# Patient Record
Sex: Female | Born: 1970 | Race: Black or African American | Hispanic: No | Marital: Single | Smoking: Never smoker
Health system: Southern US, Community
[De-identification: ages and names within clinical notes are randomized; demographics above are authoritative.]

## PROBLEM LIST (undated history)

## (undated) DIAGNOSIS — R112 Nausea with vomiting, unspecified: Secondary | ICD-10-CM

## (undated) DIAGNOSIS — T7840XA Allergy, unspecified, initial encounter: Secondary | ICD-10-CM

## (undated) DIAGNOSIS — K219 Gastro-esophageal reflux disease without esophagitis: Secondary | ICD-10-CM

## (undated) DIAGNOSIS — T8859XA Other complications of anesthesia, initial encounter: Secondary | ICD-10-CM

## (undated) DIAGNOSIS — D259 Leiomyoma of uterus, unspecified: Secondary | ICD-10-CM

## (undated) DIAGNOSIS — Z9889 Other specified postprocedural states: Secondary | ICD-10-CM

## (undated) DIAGNOSIS — K209 Esophagitis, unspecified: Secondary | ICD-10-CM

## (undated) DIAGNOSIS — T4145XA Adverse effect of unspecified anesthetic, initial encounter: Secondary | ICD-10-CM

## (undated) HISTORY — DX: Other specified postprocedural states: R11.2

## (undated) HISTORY — DX: Other specified postprocedural states: Z98.890

## (undated) HISTORY — DX: Leiomyoma of uterus, unspecified: D25.9

## (undated) HISTORY — DX: Allergy, unspecified, initial encounter: T78.40XA

## (undated) HISTORY — DX: Other complications of anesthesia, initial encounter: T88.59XA

---
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
## 1997-10-20 ENCOUNTER — Ambulatory Visit (HOSPITAL_COMMUNITY): Admission: RE | Admit: 1997-10-20 | Discharge: 1997-10-20 | Payer: Self-pay | Admitting: *Deleted

## 1998-03-29 ENCOUNTER — Other Ambulatory Visit: Admission: RE | Admit: 1998-03-29 | Discharge: 1998-03-29 | Payer: Self-pay | Admitting: Obstetrics and Gynecology

## 1998-06-15 ENCOUNTER — Ambulatory Visit (HOSPITAL_COMMUNITY): Admission: RE | Admit: 1998-06-15 | Discharge: 1998-06-15 | Payer: Self-pay | Admitting: Obstetrics and Gynecology

## 1998-09-12 ENCOUNTER — Inpatient Hospital Stay (HOSPITAL_COMMUNITY): Admission: AD | Admit: 1998-09-12 | Discharge: 1998-09-15 | Payer: Self-pay | Admitting: Obstetrics and Gynecology

## 2002-11-23 ENCOUNTER — Encounter: Payer: Self-pay | Admitting: *Deleted

## 2002-11-23 ENCOUNTER — Emergency Department (HOSPITAL_COMMUNITY): Admission: EM | Admit: 2002-11-23 | Discharge: 2002-11-23 | Payer: Self-pay | Admitting: *Deleted

## 2006-02-11 ENCOUNTER — Emergency Department (HOSPITAL_COMMUNITY): Admission: EM | Admit: 2006-02-11 | Discharge: 2006-02-11 | Payer: Self-pay | Admitting: Family Medicine

## 2007-04-27 ENCOUNTER — Emergency Department (HOSPITAL_COMMUNITY): Admission: EM | Admit: 2007-04-27 | Discharge: 2007-04-27 | Payer: Self-pay | Admitting: Emergency Medicine

## 2007-05-27 ENCOUNTER — Ambulatory Visit: Payer: Self-pay | Admitting: Gastroenterology

## 2007-06-12 ENCOUNTER — Ambulatory Visit: Payer: Self-pay | Admitting: Gastroenterology

## 2007-08-23 ENCOUNTER — Emergency Department (HOSPITAL_COMMUNITY): Admission: EM | Admit: 2007-08-23 | Discharge: 2007-08-23 | Payer: Self-pay | Admitting: Emergency Medicine

## 2007-11-17 DIAGNOSIS — R1310 Dysphagia, unspecified: Secondary | ICD-10-CM | POA: Insufficient documentation

## 2007-11-17 DIAGNOSIS — K219 Gastro-esophageal reflux disease without esophagitis: Secondary | ICD-10-CM

## 2007-11-17 DIAGNOSIS — K210 Gastro-esophageal reflux disease with esophagitis: Secondary | ICD-10-CM

## 2008-11-18 ENCOUNTER — Ambulatory Visit (HOSPITAL_COMMUNITY): Admission: RE | Admit: 2008-11-18 | Discharge: 2008-11-18 | Payer: Self-pay | Admitting: Family Medicine

## 2011-01-16 NOTE — Assessment & Plan Note (Signed)
 HEALTHCARE                         GASTROENTEROLOGY OFFICE NOTE   NAME:SOLESJacqualin, [name]                        MRN:          [id number]  DATE:05/27/2007                            DOB:          March 26, 1971    Meril Dray REQUESTING CONSULTATION:  Terrilee Files, P.A.   REASON FOR CONSULTATION:  Dysphagia and reflux symptoms.   HISTORY OF PRESENT ILLNESS:  [name] is a 40 year old African American  who relates a one month history of difficulty swallowing. She also notes  a burning pain with swallowing. Her symptoms are present with liquids  and solids, but are much more noticeable with solids. She was treated  with Prilosec 20 mg a day for 2 weeks and her symptoms improved. She  originally presented to [facility] on April 27, 2007. Her symptoms worsened that day when she was eating eggs and she  had to induce vomiting to alleviate her symptoms of dysphagia. A soft-  tissue view of the neck was obtained which showed C5-C6 degenerative  change and no acute abnormalities. Terrilee Files, P.A. discussed the  situation with Dr. Leone Payor who agreed to schedule the patient in  followup in his office. The patient is scheduled with me at this time.  She relates no weight loss, recent antibiotic usage, abdominal pain,  melena, hematochezia, constipation, diarrhea, nausea or vomiting.   PAST MEDICAL HISTORY:  Negative.   PAST SURGICAL HISTORY:  Negative.   CURRENT MEDICATIONS:  Prilosec over-the-counter daily p.r.n.-has not  been taking recently.   MEDICATION ALLERGIES:  PERCOCET.   SOCIAL HISTORY:  Per the handwritten form.   REVIEW OF SYSTEMS:  Per the handwritten form.   PHYSICAL EXAMINATION:  Well-developed, well-nourished in no acute  distress. Height 5 feet. Weight 130 pounds. Blood pressure 100/70, pulse  76 and regular.  HEENT: Anicteric sclerae. Oropharynx clear.  NECK: Without thyromegaly or adenopathy.  CHEST: Clear to  auscultation bilaterally.  CARDIAC: Regular rate and rhythm without murmurs.  ABDOMEN: Soft and nontender. Nondistended. Normoactive bowel sounds. No  palpable organomegaly, masses or hernias.  EXTREMITIES: Without clubbing, cyanosis or edema.  NEUROLOGIC: Alert and oriented x3. Grossly nonfocal.   ASSESSMENT/PLAN:  1. Dysphagia and odynophagia, rule out esophagitis, strictures,      gastroesophageal reflux disease and other disorders. She is      recommended to take Prilosec over-the-counter one p.o. q a.m. 30-60      minutes before breakfast as an ongoing medication. Begin all      standard anti-reflux measures. Risks, benefits and alternative to      upper endoscopy with possible biopsy and possible dilation      discussed with the patient and she consents to proceed. This will      be scheduled electively.     Venita Lick. Russella Dar, MD, [facility]  Electronically Signed    MTS/MedQ  DD: 05/27/2007  DT: 05/27/2007  Job #: [id number]   cc:   Terrilee Files, P.A.

## 2011-05-24 ENCOUNTER — Other Ambulatory Visit (HOSPITAL_COMMUNITY)
Admission: RE | Admit: 2011-05-24 | Discharge: 2011-05-24 | Disposition: A | Discharge status: Home / Self Care | Payer: BC Managed Care – PPO | Source: Ambulatory Visit | Attending: Women's Health | Admitting: Women's Health

## 2011-05-24 ENCOUNTER — Encounter: Payer: Self-pay | Admitting: Women's Health

## 2011-05-24 ENCOUNTER — Ambulatory Visit (INDEPENDENT_AMBULATORY_CARE_PROVIDER_SITE_OTHER): Payer: BC Managed Care – PPO | Admitting: Women's Health

## 2011-05-24 VITALS — BP 118/74 | Ht 61.0 in | Wt 134.0 lb

## 2011-05-24 DIAGNOSIS — Z01419 Encounter for gynecological examination (general) (routine) without abnormal findings: Secondary | ICD-10-CM

## 2011-05-24 DIAGNOSIS — E079 Disorder of thyroid, unspecified: Secondary | ICD-10-CM

## 2011-05-24 DIAGNOSIS — Z113 Encounter for screening for infections with a predominantly sexual mode of transmission: Secondary | ICD-10-CM

## 2011-05-24 NOTE — Patient Instructions (Signed)
Schedule mammogram.

## 2011-05-24 NOTE — Progress Notes (Signed)
[name] 1971/05/05 [id number]    History:    The patient presents for annual exam.  K1 teacher. Daughter [name] 9,  son [name] 72, doing well.  Past medical history, past surgical history, family history and social history were all reviewed and documented in the EPIC chart.   ROS:  A  ROS was performed and pertinent positives and negatives are included in the history.  Exam:  Filed Vitals:   05/24/11 1615  BP: 118/74    General appearance:  Normal Head/Neck:  Normal, without cervical or supraclavicular adenopathy. Thyroid:  Symmetrical, normal in size, without palpable masses or nodularity. Respiratory  Effort:  Normal  Auscultation:  Clear without wheezing or rhonchi Cardiovascular  Auscultation:  Regular rate, without rubs, murmurs or gallops  Edema/varicosities:  Not grossly evident Abdominal  Soft,nontender, without masses, guarding or rebound.  Liver/spleen:  No organomegaly noted  Hernia:  None appreciated  Skin  Inspection:  Grossly normal  Palpation:  Grossly normal Neurologic/psychiatric  Orientation:  Normal with appropriate conversation.  Mood/affect:  Normal  Genitourinary    Breasts: Examined lying and sitting/pendulous.     Right: Without masses, retractions, discharge or axillary adenopathy.     Left: Without masses, retractions, discharge or axillary adenopathy.   Inguinal/mons:  Normal without inguinal adenopathy  External genitalia:  Normal  BUS/Urethra/Skene's glands:  Normal  Bladder:  Normal  Vagina:  Normal  Cervix:  Normal/stenotic  Uterus:  retroverted, normal in size, shape and contour.  Midline and mobile  Adnexa/parametria:     Rt: Without masses or tenderness.   Lt: Without masses or tenderness.  Anus and perineum: Normal  Digital rectal exam: Normal sphincter tone without palpated masses or tenderness  Assessment/Plan:  40 y.o. SBF G3P2 for annual exam. Monthly 3 days cycle/vasectomy. Complaint of skin dryness and some skin  changes. She does have followup with a dermatologist. Minimal health care in the last few years. Same partner x2 years.  Normal GYN exam  Plan: SBEs, had normal mammogram 11/2008, reviewed importance of annual screening. She will schedule mammogram at breast center. Calcium rich diet, exercise, cutting calories for weight loss encouraged. CBC, TSH, UA, Pap, GC Chlamydia, HIV, hepatitis B. and C. and RPR. Encouraged flu vaccine.   Harrington Challenger [facility], 4:53 PM 05/24/2011

## 2011-05-26 LAB — HEPATITIS B SURFACE ANTIGEN: Hepatitis B Surface Ag: NEGATIVE

## 2011-05-26 LAB — HIV ANTIBODY (ROUTINE TESTING W REFLEX): HIV: NONREACTIVE

## 2011-06-13 ENCOUNTER — Other Ambulatory Visit: Payer: Self-pay | Admitting: Women's Health

## 2011-06-13 DIAGNOSIS — Z1231 Encounter for screening mammogram for malignant neoplasm of breast: Secondary | ICD-10-CM

## 2011-06-21 ENCOUNTER — Ambulatory Visit (HOSPITAL_COMMUNITY)
Admission: RE | Admit: 2011-06-21 | Discharge: 2011-06-21 | Disposition: A | Discharge status: Home / Self Care | Payer: BC Managed Care – PPO | Source: Ambulatory Visit | Attending: Women's Health | Admitting: Women's Health

## 2011-06-21 DIAGNOSIS — Z1231 Encounter for screening mammogram for malignant neoplasm of breast: Secondary | ICD-10-CM | POA: Insufficient documentation

## 2012-05-26 ENCOUNTER — Encounter: Payer: Self-pay | Admitting: Women's Health

## 2012-05-26 ENCOUNTER — Ambulatory Visit (INDEPENDENT_AMBULATORY_CARE_PROVIDER_SITE_OTHER): Payer: BC Managed Care – PPO | Admitting: Women's Health

## 2012-05-26 VITALS — BP 114/74 | Ht 61.0 in | Wt 139.0 lb

## 2012-05-26 DIAGNOSIS — Z833 Family history of diabetes mellitus: Secondary | ICD-10-CM

## 2012-05-26 DIAGNOSIS — E079 Disorder of thyroid, unspecified: Secondary | ICD-10-CM

## 2012-05-26 DIAGNOSIS — Z01419 Encounter for gynecological examination (general) (routine) without abnormal findings: Secondary | ICD-10-CM

## 2012-05-26 DIAGNOSIS — Z13220 Encounter for screening for lipoid disorders: Secondary | ICD-10-CM

## 2012-05-26 DIAGNOSIS — Z23 Encounter for immunization: Secondary | ICD-10-CM

## 2012-05-26 NOTE — Progress Notes (Signed)
ANNEKA MULLALY 1971/05/11 [id number]    History:    The patient presents for annual exam.  Monthly 3-4 d cycle/vasectomy. Normal Pap and mammogram history. History of slight anemia.   Past medical history, past surgical history, family history and social history were all reviewed and documented in the EPIC chart. Daughter Gavin Pound 60 pregnant attending [employer]. Son [name] 13 doing well. Kindergarten Runner, broadcasting/film/video.  ROS:  A  ROS was performed and pertinent positives and negatives are included in the history.  Exam:  Filed Vitals:   05/26/12 1544  BP: 114/74    General appearance:  Normal Head/Neck:  Normal, without cervical or supraclavicular adenopathy. Thyroid:  Symmetrical, normal in size, without palpable masses or nodularity. Respiratory  Effort:  Normal  Auscultation:  Clear without wheezing or rhonchi Cardiovascular  Auscultation:  Regular rate, without rubs, murmurs or gallops  Edema/varicosities:  Not grossly evident Abdominal  Soft,nontender, without masses, guarding or rebound.  Liver/spleen:  No organomegaly noted  Hernia:  Mild asymptomatic abdominal/umbilical   Skin  Inspection:  Grossly normal  Palpation:  Grossly normal Neurologic/psychiatric  Orientation:  Normal with appropriate conversation.  Mood/affect:  Normal  Genitourinary    Breasts: Examined lying and sitting/pendulous.     Right: Without masses, retractions, discharge or axillary adenopathy.     Left: Without masses, retractions, discharge or axillary adenopathy.   Inguinal/mons:  Normal without inguinal adenopathy  External genitalia:  Normal  BUS/Urethra/Skene's glands:  Normal  Bladder:  Normal  Vagina:  Normal  Cervix:  Normal  Uterus:   normal in size, shape and contour.  Midline and mobile  Adnexa/parametria:     Rt: Without masses or tenderness.   Lt: Without masses or tenderness.  Anus and perineum: Normal  Digital rectal exam: Normal sphincter tone without palpated masses or  tenderness  Assessment/Plan:  41 y.o. SBF G2 P2 for annual exam with complaint of lip swelling/ unknown etiology.  Normal GYN exam  Plan: SBE's, continue annual mammogram, calcium rich diet, MVI daily with iron daily and increase iron rich foods. CBC, glucose, lipid panel, TSH, UA and Pap not done history of normal Paps, new screening guidelines reviewed. Followup with dermatologist as needed for with swelling/allergy.    Harrington Challenger [facility], 5:19 PM 05/26/2012

## 2012-05-26 NOTE — Patient Instructions (Signed)

## 2012-05-27 ENCOUNTER — Telehealth: Payer: Self-pay | Admitting: *Deleted

## 2012-05-27 DIAGNOSIS — K429 Umbilical hernia without obstruction or gangrene: Secondary | ICD-10-CM

## 2012-05-27 LAB — CBC WITH DIFFERENTIAL/PLATELET
Eosinophils Absolute: 0.2 10*3/uL (ref 0.0–0.7)
Hemoglobin: 12.7 g/dL (ref 12.0–15.0)
Lymphocytes Relative: 36 % (ref 12–46)
Lymphs Abs: 2.2 10*3/uL (ref 0.7–4.0)
MCH: 27.4 pg (ref 26.0–34.0)
MCV: 83 fL (ref 78.0–100.0)
Monocytes Relative: 6 % (ref 3–12)
Neutrophils Relative %: 53 % (ref 43–77)
RBC: 4.64 MIL/uL (ref 3.87–5.11)
WBC: 6.2 10*3/uL (ref 4.0–10.5)

## 2012-05-27 LAB — URINALYSIS W MICROSCOPIC + REFLEX CULTURE
Bacteria, UA: NONE SEEN
Bilirubin Urine: NEGATIVE
Casts: NONE SEEN
Glucose, UA: NEGATIVE mg/dL
Hgb urine dipstick: NEGATIVE
Ketones, ur: NEGATIVE mg/dL
Protein, ur: NEGATIVE mg/dL
pH: 7 (ref 5.0–8.0)

## 2012-05-27 LAB — TSH: TSH: 1.961 u[IU]/mL (ref 0.350–4.500)

## 2012-05-27 LAB — LIPID PANEL
Cholesterol: 196 mg/dL (ref 0–200)
HDL: 33 mg/dL — ABNORMAL LOW (ref >39)
Total CHOL/HDL Ratio: 5.9 Ratio
Triglycerides: 224 mg/dL — ABNORMAL HIGH (ref <150)

## 2012-05-27 LAB — GLUCOSE, RANDOM: Glucose, Bld: 114 mg/dL — ABNORMAL HIGH (ref 70–99)

## 2012-05-27 NOTE — Telephone Encounter (Signed)
Pt was seen for annual on 05/26/12 she spoke with you about stomach hernia and a referral with general surgeon? nonething in office note about this? Please advise

## 2012-05-28 NOTE — Telephone Encounter (Signed)
Telephone call, states after office visit thought about abdominal/ umbilical hernia. States has had a burning sensation, abdominal pain at times denies any constipation or changes in bowel limitation. States has had increasing pain over the last 6 months had attributed to stress of her mother dying. States would like to be evaluated by surgeon.(There is a notation of hernia on physical )   [name] please schedule surgical consult for evaluation of abdominal/umbilical hernia. Symptoms abdominal pain with burning sensation for greater than 6 months. Dr. Jamey Ripa or or anyone in the group

## 2012-05-29 NOTE — Telephone Encounter (Signed)
Appointment with Dr.Rosenbower on 06/02/12 @ 8:50 am. Pt informed with the below note.

## 2012-06-02 ENCOUNTER — Encounter (INDEPENDENT_AMBULATORY_CARE_PROVIDER_SITE_OTHER): Payer: Self-pay | Admitting: General Surgery

## 2012-06-02 ENCOUNTER — Ambulatory Visit (INDEPENDENT_AMBULATORY_CARE_PROVIDER_SITE_OTHER): Payer: BC Managed Care – PPO | Admitting: General Surgery

## 2012-06-02 VITALS — BP 122/64 | HR 80 | Temp 97.0°F | Resp 16 | Ht 65.0 in | Wt 135.6 lb

## 2012-06-02 DIAGNOSIS — K429 Umbilical hernia without obstruction or gangrene: Secondary | ICD-10-CM

## 2012-06-02 DIAGNOSIS — M6208 Separation of muscle (nontraumatic), other site: Secondary | ICD-10-CM

## 2012-06-02 DIAGNOSIS — M6200 Separation of muscle (nontraumatic), unspecified site: Secondary | ICD-10-CM

## 2012-06-02 NOTE — Patient Instructions (Signed)
You have a small umbilical hernia and a diastases recti. These are not surgical problems at this time. You may also have a hiatal hernia which could be causing some of your symptoms. I recommend you avoid spicy foods, fried foods, and caffeine. I recommend an exercise program and weight loss. I recommend you take Prilosec once a day.

## 2012-06-02 NOTE — Progress Notes (Signed)
Patient ID: [name], female   DOB: 26-Jun-1971, 41 y.o.   MRN: [id number]  Chief Complaint  Patient presents with  . Pre-op Exam    eval umb hernia    HPI [name] is a 41 y.o. female.   HPI  She is referred by [name], nurse practitioner, for evaluation of an umbilical hernia. She has been having epigastric pains for the past year or 2. They have become more frequent recently and basically are pressure type pains. When she pushes in on the epigastric region she gets relief.  She does have some gastroesophageal reflux. All small umbilical hernia was noticed on her exam. She is here for evaluation of that.  Past Medical History  Diagnosis Date  . Hypertension     Past Surgical History  Procedure Date  . Cesarean section     Family History  Problem Relation Age of Onset  . COPD Mother   . Hypertension Mother   . Hypertension Father   . Breast cancer Maternal Aunt     after menopause  . Cancer Maternal Aunt     breast  . Cancer Maternal Grandmother     ovarian or cervical    Social History History  Substance Use Topics  . Smoking status: Never Smoker   . Smokeless tobacco: Never Used  . Alcohol Use: Yes     rare/ socially    Allergies  Allergen Reactions  . Percocet (Oxycodone-Acetaminophen)     Current Outpatient Prescriptions  Medication Sig Dispense Refill  . Multiple Vitamin (MULTIVITAMIN) tablet Take 1 tablet by mouth daily.          Review of Systems Review of Systems  Constitutional: Negative.   Respiratory: Negative.   Cardiovascular: Negative.   Gastrointestinal: Positive for abdominal pain and abdominal distention. Negative for vomiting.    Blood pressure 122/64, pulse 80, temperature 97 F (36.1 C), temperature source Temporal, resp. rate 16, height 5\' 5"  (1.651 m), weight 135 lb 9.6 oz (61.508 kg), last menstrual period 05/22/2012.  Physical Exam Physical Exam  Constitutional:       Overweight female in no acute distress  HENT:    Head: Normocephalic and atraumatic.  Abdominal: Soft. She exhibits no distension and no mass. There is no tenderness.       Small reducible umbilical hernia with no tenderness. A diastases recti is also present.  Skin: Skin is warm and dry.    Data Reviewed Office note.  Assessment    1. Small asymptomatic umbilical hernia  2. Diastases recti  3. Epigastric pain    Plan    I do not think she needs repair of her umbilical hernia at this time. The diastases recti does not need repair. Some of her symptoms may be coming from a hiatal hernia and so I gave her some dietary recommendations. I also recommended she start taking daily Prilosec. I suggested she start on an exercise program and avoid spicy foods and fatty foods.       Ariana Cavenaugh J 06/02/2012, 9:57 AM

## 2013-05-27 ENCOUNTER — Encounter: Payer: BC Managed Care – PPO | Admitting: Women's Health

## 2013-06-15 ENCOUNTER — Emergency Department (HOSPITAL_COMMUNITY)
Admission: EM | Admit: 2013-06-15 | Discharge: 2013-06-16 | Disposition: A | Discharge status: Home / Self Care | Payer: BC Managed Care – PPO | Attending: Emergency Medicine | Admitting: Emergency Medicine

## 2013-06-15 ENCOUNTER — Encounter (HOSPITAL_COMMUNITY): Payer: Self-pay | Admitting: Emergency Medicine

## 2013-06-15 DIAGNOSIS — Z8719 Personal history of other diseases of the digestive system: Secondary | ICD-10-CM | POA: Insufficient documentation

## 2013-06-15 DIAGNOSIS — J029 Acute pharyngitis, unspecified: Secondary | ICD-10-CM

## 2013-06-15 HISTORY — DX: Esophagitis, unspecified: K20.9

## 2013-06-15 LAB — RAPID STREP SCREEN (MED CTR MEBANE ONLY): Streptococcus, Group A Screen (Direct): NEGATIVE

## 2013-06-15 NOTE — ED Notes (Signed)
Unable to locate pt  

## 2013-06-15 NOTE — ED Provider Notes (Signed)
CSN: [id number]     Arrival date & time 06/15/13  2235 History   First MD Initiated Contact with Patient 06/15/13 2307     Chief Complaint  Patient presents with  . Sore Throat   (Consider location/radiation/quality/duration/timing/severity/associated sxs/prior Treatment) HPI Comments: Patient presents with a 2 day history of throat pain.  Reports increase in pain with swallowing.  Reports using  throat numbing and salt water gargles at home without relief.  Denies fever, cough, rash, nausea, vomiting, abdominal pain, diarrhea, rhinorrhea, and ear pain. No known sick contacts.    Patient is a 42 y.o. female presenting with pharyngitis. The history is provided by the patient.  Sore Throat    Past Medical History  Diagnosis Date  . Esophagitis    Past Surgical History  Procedure Laterality Date  . Cesarean section     Family History  Problem Relation Age of Onset  . COPD Mother   . Hypertension Mother   . Hypertension Father   . Breast cancer Maternal Aunt     after menopause  . Cancer Maternal Aunt     breast  . Cancer Maternal Grandmother     ovarian or cervical   History  Substance Use Topics  . Smoking status: Never Smoker   . Smokeless tobacco: Never Used  . Alcohol Use: Yes     Comment: rare/ socially   OB History   Grav Para Term Preterm Abortions TAB SAB Ect Mult Living   3 2 2  1  1   2      Review of Systems  All other systems reviewed and are negative.    Allergies  Percocet  Home Medications  No current outpatient prescriptions on file. BP 138/71  Pulse 88  Temp(Src) 98.4 F (36.9 C) (Oral)  Resp 18  Wt 142 lb 3.2 oz (64.5 kg)  BMI 23.66 kg/m2  SpO2 98%  LMP 05/27/2013 Physical Exam  Nursing note and vitals reviewed. Constitutional: Vital signs are normal. She appears well-developed and well-nourished.  HENT:  Head: Normocephalic and atraumatic.  Right Ear: Tympanic membrane normal. Tympanic membrane is not bulging.  Left Ear:  Tympanic membrane normal. Tympanic membrane is not bulging.  Nose: No rhinorrhea.  Mouth/Throat: Uvula is midline and mucous membranes are normal. Mucous membranes are not pale and not dry. No uvula swelling. Posterior oropharyngeal edema and posterior oropharyngeal erythema present. No oropharyngeal exudate or tonsillar abscesses.  Normal voice.  Eyes: EOM are normal.  Neck: Neck supple. No thyromegaly present.  Cardiovascular: Normal rate, regular rhythm and normal heart sounds.   No murmur heard. Pulmonary/Chest: Effort normal and breath sounds normal. She has no wheezes.  Abdominal: Soft. There is no tenderness. There is no rebound and no guarding.  Lymphadenopathy:       Head (right side): No submental, no submandibular and no tonsillar adenopathy present.       Head (left side): No submental, no submandibular and no tonsillar adenopathy present.    She has no cervical adenopathy.       Right cervical: No superficial cervical and no posterior cervical adenopathy present.      Left cervical: No superficial cervical and no posterior cervical adenopathy present.  Neurological: She is alert.  Skin: Skin is warm and dry. No rash noted.    ED Course  Procedures (including critical care time) Labs Review Labs Reviewed  RAPID STREP SCREEN   MDM  No diagnosis found.  Patient presents with sore throat x2 days, Physical exam  without lymphadenopathy or tonsillar exudate. Phargitis most likely viral etiology. Rapid strep ordered.  Discussed negative rapid stress test with patient and supportive measures.    Clabe Seal, PA-C 06/16/13 [phone]

## 2013-06-15 NOTE — ED Notes (Signed)
Pt reports a gradual onset of sore throat x2 days, pt reports increase pain with swallowing today, unable to eat or drink due to pain and swelling to her throat. Pt reports a hx of esophagitis.

## 2013-06-16 NOTE — ED Provider Notes (Signed)
Medical screening examination/treatment/procedure(s) were performed by non-physician practitioner and as supervising physician I was immediately available for consultation/collaboration.   Junius Argyle, MD 06/16/13 1227

## 2013-06-17 LAB — CULTURE, GROUP A STREP

## 2013-06-30 ENCOUNTER — Encounter: Payer: Self-pay | Admitting: Women's Health

## 2013-07-10 ENCOUNTER — Ambulatory Visit (INDEPENDENT_AMBULATORY_CARE_PROVIDER_SITE_OTHER): Payer: BC Managed Care – PPO | Admitting: Women's Health

## 2013-07-10 ENCOUNTER — Encounter: Payer: Self-pay | Admitting: Women's Health

## 2013-07-10 ENCOUNTER — Other Ambulatory Visit (HOSPITAL_COMMUNITY)
Admission: RE | Admit: 2013-07-10 | Discharge: 2013-07-10 | Disposition: A | Discharge status: Home / Self Care | Payer: BC Managed Care – PPO | Source: Ambulatory Visit | Attending: Gynecology | Admitting: Gynecology

## 2013-07-10 VITALS — BP 112/70 | Ht 60.5 in | Wt 144.0 lb

## 2013-07-10 DIAGNOSIS — Z01419 Encounter for gynecological examination (general) (routine) without abnormal findings: Secondary | ICD-10-CM

## 2013-07-10 DIAGNOSIS — R5381 Other malaise: Secondary | ICD-10-CM

## 2013-07-10 DIAGNOSIS — Z833 Family history of diabetes mellitus: Secondary | ICD-10-CM

## 2013-07-10 DIAGNOSIS — N898 Other specified noninflammatory disorders of vagina: Secondary | ICD-10-CM

## 2013-07-10 DIAGNOSIS — Z13220 Encounter for screening for lipoid disorders: Secondary | ICD-10-CM

## 2013-07-10 LAB — CBC WITH DIFFERENTIAL/PLATELET
Basophils Relative: 0 % (ref 0–1)
Eosinophils Absolute: 0.2 10*3/uL (ref 0.0–0.7)
HCT: 35.2 % — ABNORMAL LOW (ref 36.0–46.0)
Hemoglobin: 12 g/dL (ref 12.0–15.0)
Lymphs Abs: 3.2 10*3/uL (ref 0.7–4.0)
MCH: 27.9 pg (ref 26.0–34.0)
Monocytes Relative: 7 % (ref 3–12)
Neutro Abs: 3.8 10*3/uL (ref 1.7–7.7)
Neutrophils Relative %: 49 % (ref 43–77)
RBC: 4.3 MIL/uL (ref 3.87–5.11)

## 2013-07-10 LAB — LIPID PANEL
Cholesterol: 180 mg/dL (ref 0–200)
HDL: 35 mg/dL — ABNORMAL LOW (ref >39)
Total CHOL/HDL Ratio: 5.1 Ratio

## 2013-07-10 LAB — GLUCOSE, RANDOM: Glucose, Bld: 85 mg/dL (ref 70–99)

## 2013-07-10 MED ORDER — TERCONAZOLE 0.8 % VA CREA: 1.0000 | TOPICAL_CREAM | Freq: Every day | VAGINAL | Status: DC

## 2013-07-10 NOTE — Progress Notes (Signed)
[name] 21-Apr-1971 [id number]    History:    The patient presents for annual exam.  Monthly cycles/vasectomy. Not sexually active/fiance prostate ca. Normal Pap and mammogram history.   Past medical history, past surgical history, family history and social history were all reviewed and documented in the EPIC chart. 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 Markel  14 doing well. Fiance has 9 and 11 year olds. Parents hypertension, mother COPD.  ROS:  A  ROS was performed and pertinent positives and negatives are included in the history.  Exam:  Filed Vitals:   07/10/13 1501  BP: 112/70    General appearance:  Normal Head/Neck:  Normal, without cervical or supraclavicular adenopathy. Thyroid:  Symmetrical, normal in size, without palpable masses or nodularity. Respiratory  Effort:  Normal  Auscultation:  Clear without wheezing or rhonchi Cardiovascular  Auscultation:  Regular rate, without rubs, murmurs or gallops  Edema/varicosities:  Not grossly evident Abdominal  Soft,nontender, without masses, guarding or rebound.  Liver/spleen:  No organomegaly noted  Hernia:  None appreciated  Skin  Inspection:  Grossly normal  Palpation:  Grossly normal Neurologic/psychiatric  Orientation:  Normal with appropriate conversation.  Mood/affect:  Normal  Genitourinary    Breasts: Examined lying and sitting.     Right: Without masses, retractions, discharge or axillary adenopathy.     Left: Without masses, retractions, discharge or axillary adenopathy.   Inguinal/mons:  Normal without inguinal adenopathy  External genitalia:  Normal  BUS/Urethra/Skene's glands:  Normal  Bladder:  Normal  Vagina:  Normal  Cervix:  Normal  Uterus:   normal in size, shape and contour.  Midline and mobile  Adnexa/parametria:     Rt: Without masses or tenderness.   Lt: Without masses or tenderness.  Anus and perineum: Normal  Digital rectal exam: Normal sphincter  tone without palpated masses or tenderness  Assessment/Plan:  42 y.o. MBF G2P2 for annual exam with complaint of fatigue.  Normal GYN exam Situational fatigue  Plan: SBE's, continue annual mammogram, 3D tomography reviewed and encouraged history of dense breasts. Reviewed importance of increasing regular exercise, decreasing calories for weight loss and energy. MVI daily encouraged. CBC, glucose, TSH, lipid panel, UA, Pap. Pap normal 2012, new screening guidelines reviewed.   Harrington Challenger [facility], 4:34 PM 07/10/2013

## 2013-07-10 NOTE — Patient Instructions (Signed)
Monilial Vaginitis  Vaginitis in a soreness, swelling and redness (inflammation) of the vagina and vulva. Monilial vaginitis is not a sexually transmitted infection.  CAUSES   Yeast vaginitis is caused by yeast (candida) that is normally found in your vagina. With a yeast infection, the candida has overgrown in number to a point that upsets the chemical balance.  SYMPTOMS   · White, thick vaginal discharge.  · Swelling, itching, redness and irritation of the vagina and possibly the lips of the vagina (vulva).  · Burning or painful urination.  · Painful intercourse.  DIAGNOSIS   Things that may contribute to monilial vaginitis are:  · Postmenopausal and virginal states.  · Pregnancy.  · Infections.  · Being tired, sick or stressed, especially if you had monilial vaginitis in the past.  · Diabetes. Good control will help lower the chance.  · Birth control pills.  · Tight fitting garments.  · Using bubble bath, feminine sprays, douches or deodorant tampons.  · Taking certain medications that kill germs (antibiotics).  · Sporadic recurrence can occur if you become ill.  TREATMENT   Your caregiver will give you medication.  · There are several kinds of anti monilial vaginal creams and suppositories specific for monilial vaginitis. For recurrent yeast infections, use a suppository or cream in the vagina 2 times a week, or as directed.  · Anti-monilial or steroid cream for the itching or irritation of the vulva may also be used. Get your caregiver's permission.  · Painting the vagina with methylene blue solution may help if the monilial cream does not work.  · Eating yogurt may help prevent monilial vaginitis.  HOME CARE INSTRUCTIONS   · Finish all medication as prescribed.  · Do not have sex until treatment is completed or after your caregiver tells you it is okay.  · Take warm sitz baths.  · Do not douche.  · Do not use tampons, especially scented ones.  · Wear cotton underwear.  · Avoid tight pants and panty  hose.  · Tell your sexual partner that you have a yeast infection. They should go to their caregiver if they have symptoms such as mild rash or itching.  · Your sexual partner should be treated as well if your infection is difficult to eliminate.  · Practice safer sex. Use condoms.  · Some vaginal medications cause latex condoms to fail. Vaginal medications that harm condoms are:  · Cleocin cream.  · Butoconazole (Femstat®).  · Terconazole (Terazol®) vaginal suppository.  · Miconazole (Monistat®) (may be purchased over the counter).  SEEK MEDICAL CARE IF:   · You have a temperature by mouth above 102° F (38.9° C).  · The infection is getting worse after 2 days of treatment.  · The infection is not getting better after 3 days of treatment.  · You develop blisters in or around your vagina.  · You develop vaginal bleeding, and it is not your menstrual period.  · You have pain when you urinate.  · You develop intestinal problems.  · You have pain with sexual intercourse.  Document Released: 05/30/2005 Document Revised: 11/12/2011 Document Reviewed: 02/11/2009  [employer]® Patient Information ©2014 [employer].

## 2013-07-11 LAB — URINALYSIS W MICROSCOPIC + REFLEX CULTURE
Bacteria, UA: NONE SEEN
Bilirubin Urine: NEGATIVE
Casts: NONE SEEN
Glucose, UA: NEGATIVE mg/dL
Hgb urine dipstick: NEGATIVE
Leukocytes, UA: NEGATIVE
Protein, ur: NEGATIVE mg/dL
Squamous Epithelial / LPF: NONE SEEN
Urobilinogen, UA: 1 mg/dL (ref 0.0–1.0)

## 2013-07-11 LAB — TSH: TSH: 2.034 u[IU]/mL (ref 0.350–4.500)

## 2013-11-02 ENCOUNTER — Other Ambulatory Visit: Payer: Self-pay | Admitting: Women's Health

## 2013-11-02 DIAGNOSIS — Z1231 Encounter for screening mammogram for malignant neoplasm of breast: Secondary | ICD-10-CM

## 2013-11-11 ENCOUNTER — Ambulatory Visit (HOSPITAL_COMMUNITY)
Admission: RE | Admit: 2013-11-11 | Discharge: 2013-11-11 | Disposition: A | Discharge status: Home / Self Care | Payer: BC Managed Care – PPO | Source: Ambulatory Visit | Attending: Women's Health | Admitting: Women's Health

## 2013-11-11 DIAGNOSIS — Z1231 Encounter for screening mammogram for malignant neoplasm of breast: Secondary | ICD-10-CM

## 2013-12-29 ENCOUNTER — Encounter (HOSPITAL_COMMUNITY): Payer: Self-pay | Admitting: Emergency Medicine

## 2013-12-29 ENCOUNTER — Emergency Department (HOSPITAL_COMMUNITY): Payer: BC Managed Care – PPO

## 2013-12-29 ENCOUNTER — Emergency Department (HOSPITAL_COMMUNITY)
Admission: EM | Admit: 2013-12-29 | Discharge: 2013-12-29 | Disposition: A | Discharge status: Home / Self Care | Payer: BC Managed Care – PPO | Attending: Emergency Medicine | Admitting: Emergency Medicine

## 2013-12-29 DIAGNOSIS — J3489 Other specified disorders of nose and nasal sinuses: Secondary | ICD-10-CM | POA: Insufficient documentation

## 2013-12-29 DIAGNOSIS — J40 Bronchitis, not specified as acute or chronic: Secondary | ICD-10-CM | POA: Insufficient documentation

## 2013-12-29 DIAGNOSIS — R6889 Other general symptoms and signs: Secondary | ICD-10-CM | POA: Insufficient documentation

## 2013-12-29 DIAGNOSIS — Z8719 Personal history of other diseases of the digestive system: Secondary | ICD-10-CM | POA: Insufficient documentation

## 2013-12-29 DIAGNOSIS — H5789 Other specified disorders of eye and adnexa: Secondary | ICD-10-CM | POA: Insufficient documentation

## 2013-12-29 MED ORDER — PREDNISONE 20 MG PO TABS
60.0000 mg | ORAL_TABLET | Freq: Once | ORAL | Status: AC
  Administered 2013-12-29: 60 mg via ORAL
  Filled 2013-12-29: qty 3

## 2013-12-29 MED ORDER — ALBUTEROL SULFATE HFA 108 (90 BASE) MCG/ACT IN AERS
2.0000 | INHALATION_SPRAY | RESPIRATORY_TRACT | Status: DC
  Administered 2013-12-29: 2 via RESPIRATORY_TRACT
  Filled 2013-12-29: qty 6.7

## 2013-12-29 MED ORDER — PREDNISONE 10 MG PO TABS: ORAL_TABLET | ORAL | Status: DC

## 2013-12-29 NOTE — ED Notes (Signed)
Patient is alert and orientedx4.  Patient was explained discharge instructions and they understood them with no questions.   

## 2013-12-29 NOTE — Discharge Instructions (Signed)
Continue current medications. Take inhaler 2 puffs every 4 hrs. Follow up with your doctor for recheck. Return if worsening symptoms.     Bronchitis Bronchitis is inflammation of the airways that extend from the windpipe into the lungs (bronchi). The inflammation often causes mucus to develop, which leads to a cough. If the inflammation becomes severe, it may cause shortness of breath. CAUSES  Bronchitis may be caused by:   Viral infections.   Bacteria.   Cigarette smoke.   Allergens, pollutants, and other irritants.  SIGNS AND SYMPTOMS  The most common symptom of bronchitis is a frequent cough that produces mucus. Other symptoms include:  Fever.   Body aches.   Chest congestion.   Chills.   Shortness of breath.   Sore throat.  DIAGNOSIS  Bronchitis is usually diagnosed through a medical history and physical exam. Tests, such as chest X-rays, are sometimes done to rule out other conditions.  TREATMENT  You may need to avoid contact with whatever caused the problem (smoking, for example). Medicines are sometimes needed. These may include:  Antibiotics. These may be prescribed if the condition is caused by bacteria.  Cough suppressants. These may be prescribed for relief of cough symptoms.   Inhaled medicines. These may be prescribed to help open your airways and make it easier for you to breathe.   Steroid medicines. These may be prescribed for those with recurrent (chronic) bronchitis. HOME CARE INSTRUCTIONS  Get plenty of rest.   Drink enough fluids to keep your urine clear or pale yellow (unless you have a medical condition that requires fluid restriction). Increasing fluids may help thin your secretions and will prevent dehydration.   Only take over-the-counter or prescription medicines as directed by your health care provider.  Only take antibiotics as directed. Make sure you finish them even if you start to feel better.  Avoid secondhand smoke,  irritating chemicals, and strong fumes. These will make bronchitis worse. If you are a smoker, quit smoking. Consider using nicotine gum or skin patches to help control withdrawal symptoms. Quitting smoking will help your lungs heal faster.   Put a cool-mist humidifier in your bedroom at night to moisten the air. This may help loosen mucus. Change the water in the humidifier daily. You can also run the hot water in your shower and sit in the bathroom with the door closed for 5 10 minutes.   Follow up with your health care provider as directed.   Wash your hands frequently to avoid catching bronchitis again or spreading an infection to others.  SEEK MEDICAL CARE IF: Your symptoms do not improve after 1 week of treatment.  SEEK IMMEDIATE MEDICAL CARE IF:  Your fever increases.  You have chills.   You have chest pain.   You have worsening shortness of breath.   You have bloody sputum.  You faint.  You have lightheadedness.  You have a severe headache.   You vomit repeatedly. MAKE SURE YOU:   Understand these instructions.  Will watch your condition.  Will get help right away if you are not doing well or get worse. Document Released: 08/20/2005 Document Revised: 06/10/2013 Document Reviewed: 04/14/2013 [facility] Patient Information 2014 [employer].

## 2013-12-29 NOTE — ED Notes (Signed)
Reports having a cold, cough and chest congestion since Thursday. Non productive cough. Chest wall pain when she coughs.

## 2013-12-29 NOTE — ED Notes (Signed)
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

## 2013-12-29 NOTE — ED Provider Notes (Signed)
CSN: [id number]     Arrival date & time 12/29/13  2229 History   First MD Initiated Contact with Patient 12/29/13 0930     Chief Complaint  Patient presents with  . Cough     (Consider location/radiation/quality/duration/timing/severity/associated sxs/prior Treatment) HPI [name] is a 43 y.o. female who presents emergency department complaining of cough for 10 days. Patient states that her cough is nonproductive, constant, with associated chest wall pain. States she is progressively worse short of breath. She states she was seen by her primary care Dr. 3 days ago. States that prior to that she took amoxicillin that she had left over and took over-the-counter cough medications and Tessalon Perles. States that her doctor she was given a Z-Pak and Hycodan which she's currently taking and nothing is helping her symptoms. She does admit to congestion as well, sneezing, watery eyes. Denies any prior seasonal allergy symptoms. She denies any chest pain other than when she coughs. None on the, vomiting, diarrhea. No swelling of extremities. No other symptoms.  Past Medical History  Diagnosis Date  . Esophagitis    Past Surgical History  Procedure Laterality Date  . Cesarean section     Family History  Problem Relation Age of Onset  . COPD Mother   . Hypertension Mother   . Hypertension Father   . Breast cancer Maternal Aunt     after menopause  . Cancer Maternal Aunt     breast  . Cancer Maternal Grandmother     ovarian or cervical   History  Substance Use Topics  . Smoking status: Never Smoker   . Smokeless tobacco: Never Used  . Alcohol Use: Yes     Comment: rare/ socially   OB History   Grav Para Term Preterm Abortions TAB SAB Ect Mult Living   3 2 2  1  1   2      Review of Systems  Constitutional: Negative for fever and chills.  HENT: Positive for congestion. Negative for sore throat and trouble swallowing.   Respiratory: Positive for cough and shortness of breath.  Negative for chest tightness.   Cardiovascular: Negative for chest pain, palpitations and leg swelling.  Gastrointestinal: Negative for nausea, vomiting, abdominal pain and diarrhea.  Genitourinary: Negative for dysuria and flank pain.  Musculoskeletal: Negative for arthralgias, myalgias, neck pain and neck stiffness.  Skin: Negative for rash.  Neurological: Negative for dizziness, weakness and headaches.  All other systems reviewed and are negative.     Allergies  Percocet  Home Medications   Prior to Admission medications   Medication Sig Start Date End Date Taking? Authorizing Provider  azithromycin (ZITHROMAX) 250 MG tablet Take 1-2 tablets by mouth daily. Take 2 tablets by mouth on day 1, take 1 tablet by mouth days 2-5 12/27/13  Yes Historical Provider, MD  HYDROcodone-homatropine (HYCODAN) 5-1.5 MG/5ML syrup Take 5 mLs by mouth at bedtime as needed. For cough/sleep 12/27/13  Yes Historical Provider, MD  Multiple Vitamin (MULTIVITAMIN WITH MINERALS) TABS tablet Take 1 tablet by mouth daily.   Yes Historical Provider, MD   BP 117/76  Pulse 95  Temp(Src) 98.6 F (37 C) (Oral)  Resp 18  SpO2 100% Physical Exam  Nursing note and vitals reviewed. Constitutional: She is oriented to person, place, and time. She appears well-developed and well-nourished. No distress.  HENT:  Head: Normocephalic.  Eyes: Conjunctivae are normal.  Neck: Neck supple.  Cardiovascular: Normal rate, regular rhythm and normal heart sounds.   Pulmonary/Chest: Effort  normal and breath sounds normal. No respiratory distress. She has no wheezes. She has no rales.  Abdominal: Soft. Bowel sounds are normal. She exhibits no distension. There is no tenderness. There is no rebound.  Musculoskeletal: She exhibits no edema.  Neurological: She is alert and oriented to person, place, and time.  Skin: Skin is warm and dry.  Psychiatric: She has a normal mood and affect. Her behavior is normal.    ED Course   Procedures (including critical care time) Labs Review Labs Reviewed - No data to display  Imaging Review Dg Chest 2 View  12/29/2013   CLINICAL DATA:  Cough, congestion, fever  EXAM: CHEST  2 VIEW  COMPARISON:  None.  FINDINGS: Normal heart size, mediastinal contours, and pulmonary vascularity.  Lungs clear.  No pneumothorax.  Bones unremarkable.  IMPRESSION: Normal exam.   Electronically Signed   By: Lavonia Dana M.D.   On: 12/29/2013 10:33     EKG Interpretation None      MDM   Final diagnoses:  Bronchitis    Patient with cough, URI symptoms. Requesting chest x-ray. Chest x-ray is negative. She's already taking a Z-Pak, Hycodan, over-the-counter medications. Will continue dose. Her vital signs here are normal, the she's not in any respiratory distress, respiratory rate is 18, oxygen saturation is 100% room air, heart rate is normal. She's afebrile. I do not think she has a PE and there is no evidence of ACS. Total at prednisone, 5 day taper, and have given her an inhaler to take 4 shortness of breath and cough. She is to followup with her primary care Dr.   Danley Danker Vitals:   12/29/13 0929  BP: 117/76  Pulse: 95  Temp: 98.6 F (37 C)  TempSrc: Oral  Resp: 18  SpO2: 100%      Theophil Thivierge A Cephus Tupy, PA-C 12/29/13 1114

## 2013-12-29 NOTE — ED Provider Notes (Signed)
Medical screening examination/treatment/procedure(s) were performed by non-physician practitioner and as supervising physician I was immediately available for consultation/collaboration.   EKG Interpretation None       Merryl Hacker, MD 12/29/13 1850

## 2014-05-05 ENCOUNTER — Other Ambulatory Visit: Payer: Self-pay | Admitting: Family Medicine

## 2014-05-05 DIAGNOSIS — R109 Unspecified abdominal pain: Secondary | ICD-10-CM

## 2014-05-06 ENCOUNTER — Ambulatory Visit: Payer: BC Managed Care – PPO | Admitting: Women's Health

## 2014-05-07 ENCOUNTER — Ambulatory Visit
Admission: RE | Admit: 2014-05-07 | Discharge: 2014-05-07 | Disposition: A | Discharge status: Home / Self Care | Payer: BC Managed Care – PPO | Source: Ambulatory Visit | Attending: Family Medicine | Admitting: Family Medicine

## 2014-05-07 DIAGNOSIS — R109 Unspecified abdominal pain: Secondary | ICD-10-CM

## 2014-05-11 ENCOUNTER — Telehealth: Payer: Self-pay

## 2014-05-11 NOTE — Telephone Encounter (Signed)
Telephone call, reviewed normality of ultrasound, small 2-1/2 cm fibroid, small corpus luteum cyst noted on left ovary, right ovary normal.

## 2014-05-11 NOTE — Telephone Encounter (Signed)
Patient had ultrasound done at [place] on Friday and wondered if you had reviewed results yet.

## 2014-07-05 ENCOUNTER — Encounter (HOSPITAL_COMMUNITY): Payer: Self-pay | Admitting: Emergency Medicine

## 2014-08-05 ENCOUNTER — Encounter: Payer: Self-pay | Admitting: Women's Health

## 2014-08-05 ENCOUNTER — Ambulatory Visit (INDEPENDENT_AMBULATORY_CARE_PROVIDER_SITE_OTHER): Payer: BC Managed Care – PPO | Admitting: Women's Health

## 2014-08-05 VITALS — BP 115/80 | Ht 60.0 in | Wt 144.0 lb

## 2014-08-05 DIAGNOSIS — R5383 Other fatigue: Secondary | ICD-10-CM

## 2014-08-05 DIAGNOSIS — Z01419 Encounter for gynecological examination (general) (routine) without abnormal findings: Secondary | ICD-10-CM

## 2014-08-05 DIAGNOSIS — Z13220 Encounter for screening for lipoid disorders: Secondary | ICD-10-CM

## 2014-08-05 DIAGNOSIS — N9089 Other specified noninflammatory disorders of vulva and perineum: Secondary | ICD-10-CM

## 2014-08-05 LAB — COMPREHENSIVE METABOLIC PANEL
ALT: 14 U/L (ref 0–35)
AST: 16 U/L (ref 0–37)
Albumin: 4.3 g/dL (ref 3.5–5.2)
Alkaline Phosphatase: 46 U/L (ref 39–117)
BILIRUBIN TOTAL: 0.5 mg/dL (ref 0.2–1.2)
BUN: 10 mg/dL (ref 6–23)
CALCIUM: 9.6 mg/dL (ref 8.4–10.5)
CHLORIDE: 102 meq/L (ref 96–112)
CO2: 27 meq/L (ref 19–32)
CREATININE: 0.66 mg/dL (ref 0.50–1.10)
GLUCOSE: 93 mg/dL (ref 70–99)
Potassium: 4.3 mEq/L (ref 3.5–5.3)
Sodium: 137 mEq/L (ref 135–145)
Total Protein: 7.5 g/dL (ref 6.0–8.3)

## 2014-08-05 LAB — CBC WITH DIFFERENTIAL/PLATELET
Basophils Absolute: 0 10*3/uL (ref 0.0–0.1)
Basophils Relative: 0 % (ref 0–1)
EOS ABS: 0.1 10*3/uL (ref 0.0–0.7)
EOS PCT: 2 % (ref 0–5)
HEMATOCRIT: 35.4 % — AB (ref 36.0–46.0)
HEMOGLOBIN: 12 g/dL (ref 12.0–15.0)
LYMPHS ABS: 2.7 10*3/uL (ref 0.7–4.0)
LYMPHS PCT: 37 % (ref 12–46)
MCH: 27.4 pg (ref 26.0–34.0)
MCHC: 33.9 g/dL (ref 30.0–36.0)
MCV: 80.8 fL (ref 78.0–100.0)
MONOS PCT: 7 % (ref 3–12)
MPV: 10.6 fL (ref 9.4–12.4)
Monocytes Absolute: 0.5 10*3/uL (ref 0.1–1.0)
Neutro Abs: 4 10*3/uL (ref 1.7–7.7)
Neutrophils Relative %: 54 % (ref 43–77)
Platelets: 268 10*3/uL (ref 150–400)
RBC: 4.38 MIL/uL (ref 3.87–5.11)
RDW: 15.3 % (ref 11.5–15.5)
WBC: 7.4 10*3/uL (ref 4.0–10.5)

## 2014-08-05 LAB — LIPID PANEL
Cholesterol: 159 mg/dL (ref 0–200)
HDL: 39 mg/dL — AB (ref >39)
LDL Cholesterol: 90 mg/dL (ref 0–99)
Total CHOL/HDL Ratio: 4.1 Ratio
Triglycerides: 148 mg/dL (ref <150)
VLDL: 30 mg/dL (ref 0–40)

## 2014-08-05 LAB — TSH: TSH: 1.471 u[IU]/mL (ref 0.350–4.500)

## 2014-08-05 NOTE — Patient Instructions (Signed)

## 2014-08-05 NOTE — Progress Notes (Signed)
[name] 1971/01/04 [id number]    History:    Presents for annual exam.  Monthly cycles/vasectomy. Fianc prostate cancer unable to have an erection. Normal Pap and mammogram history.  Past medical history, past surgical history, family history and social history were all reviewed and documented in the EPIC chart. Kindergarten Pharmacist, hospital, completing a Psychologist, sport and exercise program. [name] 13 doing well, daughter has a baby. Parents hypertension, mother COPD, deceased.  ROS:  A  12 point ROS was performed and pertinent positives and negatives are included.  Exam:  Filed Vitals:   08/05/14 1029  BP: 115/80    General appearance:  Normal Thyroid:  Symmetrical, normal in size, without palpable masses or nodularity. Respiratory  Auscultation:  Clear without wheezing or rhonchi Cardiovascular  Auscultation:  Regular rate, without rubs, murmurs or gallops  Edema/varicosities:  Not grossly evident Abdominal  Soft,nontender, without masses, guarding or rebound.  Liver/spleen:  No organomegaly noted  Hernia: Above umbilicus questionable hernia  Skin  Inspection:  Grossly normal   Breasts: Examined lying and sitting.     Right: Without masses, retractions, discharge or axillary adenopathy.     Left: Without masses, retractions, discharge or axillary adenopathy. Gentitourinary   Inguinal/mons:  Normal without inguinal adenopathy  External genitalia:  Questionable HSV lesion on right labia, HSV culture taken  BUS/Urethra/Skene's glands:  Normal  Vagina:  Normal  Cervix:  Normal  Uterus:  normal in size, shape and contour.  Midline and mobile  Adnexa/parametria:     Rt: Without masses or tenderness.   Lt: Without masses or tenderness.  Anus and perineum: Normal  Digital rectal exam: Normal sphincter tone without palpated masses or tenderness  Assessment/Plan:  43 y.o. SBF G2P2 for annual exam with tenderness above  umbilicus.    Monthly cycle/not sexually active Questionable HSV  lesion Questionable abdominal hernia  Plan: SBE's, continue annual mammogram, calcium rich diet, vitamin D 1000 daily, regular exercise encouraged. Will refer to  surgeon to evaluate questionable abdominal hernia. Reviewed importance of decreasing calories, increasing exercise for weight loss, CBC, CMP, lipid panel, UA, Pap normal 2014, new screening guidelines reviewed. HSV culture pending.    [name] WHNP, 11:05 AM 08/05/2014

## 2014-08-05 NOTE — Addendum Note (Signed)
Addended by: Burnett Kanaris on: 08/05/2014 01:00 PM   Modules accepted: Orders, SmartSet

## 2014-08-06 ENCOUNTER — Telehealth: Payer: Self-pay | Admitting: *Deleted

## 2014-08-06 DIAGNOSIS — K469 Unspecified abdominal hernia without obstruction or gangrene: Secondary | ICD-10-CM

## 2014-08-06 DIAGNOSIS — K458 Other specified abdominal hernia without obstruction or gangrene: Secondary | ICD-10-CM

## 2014-08-06 NOTE — Telephone Encounter (Signed)
[name] patient is scheduled on 08/11/14  @ 2:45 pm with Dr.Goss but I was informed that pt will need to have an abdominal ultrasound before appointment. Please advise

## 2014-08-06 NOTE — Telephone Encounter (Signed)
-----   Message from [name], NP sent at 08/05/2014 11:13 AM EST ----- (Pt goes by Lelon Frohlich)  Needs surgical consult for evaluation for possible abdominal hernia, any time okay.

## 2014-08-06 NOTE — Telephone Encounter (Signed)
Please schedule abdominal ultrasound

## 2014-08-09 LAB — HERPES SIMPLEX VIRUS CULTURE: ORGANISM ID, BACTERIA: NOT DETECTED

## 2014-08-09 NOTE — Telephone Encounter (Signed)
Ultrasound on 08/11/14 @ 7:30 am pt must be npo after midnight informed with the below as well.

## 2014-08-11 ENCOUNTER — Ambulatory Visit (HOSPITAL_COMMUNITY): Payer: BC Managed Care – PPO

## 2014-08-12 ENCOUNTER — Ambulatory Visit (HOSPITAL_COMMUNITY): Payer: BC Managed Care – PPO

## 2014-08-12 ENCOUNTER — Ambulatory Visit (HOSPITAL_COMMUNITY)
Admission: RE | Admit: 2014-08-12 | Discharge: 2014-08-12 | Disposition: A | Discharge status: Home / Self Care | Payer: BC Managed Care – PPO | Source: Ambulatory Visit | Attending: Women's Health | Admitting: Women's Health

## 2014-08-12 ENCOUNTER — Telehealth: Payer: Self-pay | Admitting: *Deleted

## 2014-08-12 DIAGNOSIS — K458 Other specified abdominal hernia without obstruction or gangrene: Secondary | ICD-10-CM

## 2014-08-12 DIAGNOSIS — K469 Unspecified abdominal hernia without obstruction or gangrene: Secondary | ICD-10-CM | POA: Insufficient documentation

## 2014-08-12 DIAGNOSIS — N2889 Other specified disorders of kidney and ureter: Secondary | ICD-10-CM

## 2014-08-12 NOTE — Telephone Encounter (Signed)
Pt informed with the below note. 

## 2014-08-12 NOTE — Telephone Encounter (Signed)
Appointment on 08/18/14 @ 8:45 am , NPO 4 hours prior will pick up contrast at [facility] and drink at 7am and 8 am

## 2014-08-12 NOTE — Telephone Encounter (Signed)
Per nancy young from pt ultrasound report it was recommended by radiologist for pt to have a abdominal pelvic CT scan due to cortical thickening of the left kidney which is nonspecific and may be secondary to the angel of the scan given difficulty with visualization of the left kidney. Was informed with this and this will be scheduled.

## 2014-08-18 ENCOUNTER — Ambulatory Visit (HOSPITAL_COMMUNITY)
Admission: RE | Admit: 2014-08-18 | Discharge: 2014-08-18 | Disposition: A | Discharge status: Home / Self Care | Payer: BC Managed Care – PPO | Source: Ambulatory Visit | Attending: Women's Health | Admitting: Women's Health

## 2014-08-18 ENCOUNTER — Other Ambulatory Visit: Payer: Self-pay | Admitting: Women's Health

## 2014-08-18 DIAGNOSIS — N2889 Other specified disorders of kidney and ureter: Secondary | ICD-10-CM | POA: Insufficient documentation

## 2014-08-18 DIAGNOSIS — D251 Intramural leiomyoma of uterus: Secondary | ICD-10-CM | POA: Insufficient documentation

## 2014-08-18 DIAGNOSIS — N83202 Unspecified ovarian cyst, left side: Secondary | ICD-10-CM

## 2014-08-18 DIAGNOSIS — N8320 Unspecified ovarian cysts: Secondary | ICD-10-CM | POA: Diagnosis not present

## 2014-08-18 MED ORDER — IOHEXOL 300 MG/ML  SOLN
100.0000 mL | Freq: Once | INTRAMUSCULAR | Status: AC | PRN
  Administered 2014-08-18: 100 mL via INTRAVENOUS

## 2014-08-25 ENCOUNTER — Other Ambulatory Visit (INDEPENDENT_AMBULATORY_CARE_PROVIDER_SITE_OTHER): Payer: Self-pay | Admitting: Surgery

## 2014-08-25 NOTE — H&P (Signed)
[name] 08/25/2014 9:06 AM Location: [facility] Patient #: [id number] DOB: 06/22/1971 Single / Language: Cleophus Molt / Race: Black or African American Female History of Present Illness Adin Hector MD; 08/25/2014 9:56 AM) The patient is a 43 year old female who presents with an incisional hernia. Patient sent for surgical consultation by Elon Alas, nurse practitioner, [facility], for concerns of possible abdominal wall hernia.  Pleasant overweight female with known history of periumbilical ventral hernia. Was seen by my partner, Dr. Zella Richer in 2013. Umbilical hernia was minimally symptomatic. Mild diastases recti. Observation offered.  Patient notes she feels the area is larger and more sensitive. Underwent workup for abdominal pain. Ultrasound revealed questionable renal mass. Follow-up CT scan revealed no renal mass. Small periumbilical ventral hernias 2 can be seen. Patient tends to be constipated with 2 bowel movements a week. Had 2 C-sections done years ago. She can walk okay. She does get intermittent sharp pains especially when lifting her "grandbaby" and twisting. She does have a history of chronic heartburn/reflux followed by Dr Fuller Plan with gastroenterology. Appears to be controlled with Prilosec. No major bleeding problems. Can tolerate most foods. No evidences of nausea vomiting or bloating. She is relatively active. 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 No history of skin infections.   Physical Exam Adin Hector MD; 08/25/2014 9:32 AM)  General Mental Status-Alert. General Appearance-Not in acute distress, Not Sickly. Orientation-Oriented X3. Hydration-Well hydrated. Voice-Normal.  Integumentary Global Assessment Upon inspection and palpation of skin surfaces of the - Axillae: non-tender, no inflammation or ulceration, no drainage. and Distribution of scalp and body hair is normal. General Characteristics Temperature -  normal warmth is noted.  Head and Neck Head-normocephalic, atraumatic with no lesions or palpable masses. Face Global Assessment - atraumatic, no absence of expression. Neck Global Assessment - no abnormal movements, no bruit auscultated on the right, no bruit auscultated on the left, no decreased range of motion, non-tender. Trachea-midline. Thyroid Gland Characteristics - non-tender.  Eye Eyeball - Left-Extraocular movements intact, No Nystagmus. Eyeball - Right-Extraocular movements intact, No Nystagmus. Cornea - Left-No Hazy. Cornea - Right-No Hazy. Sclera/Conjunctiva - Left-No scleral icterus, No Discharge. Sclera/Conjunctiva - Right-No scleral icterus, No Discharge. Pupil - Left-Direct reaction to light normal. Pupil - Right-Direct reaction to light normal.  ENMT Ears Pinna - Left - no drainage observed, no generalized tenderness observed. Right - no drainage observed, no generalized tenderness observed. Nose and Sinuses External Inspection of the Nose - no destructive lesion observed. Inspection of the nares - Left - quiet respiration. Right - quiet respiration. Mouth and Throat Lips - Upper Lip - no fissures observed, no pallor noted. Lower Lip - no fissures observed, no pallor noted. Nasopharynx - no discharge present. Oral Cavity/Oropharynx - Tongue - no dryness observed. Oral Mucosa - no cyanosis observed. Hypopharynx - no evidence of airway distress observed.  Chest and Lung Exam Inspection Movements - Normal and Symmetrical. Accessory muscles - No use of accessory muscles in breathing. Palpation Palpation of the chest reveals - Non-tender. Auscultation Breath sounds - Normal and Clear.  Cardiovascular Auscultation Rhythm - Regular. Murmurs & Other Heart Sounds - Auscultation of the heart reveals - No Murmurs and No Systolic Clicks.  Abdomen Inspection Inspection of the abdomen reveals - No Visible peristalsis and No Abnormal pulsations.  Umbilicus - No Bleeding, No Urine drainage. Palpation/Percussion Palpation and Percussion of the abdomen reveal - Soft, Non Tender, No Rebound tenderness, No Rigidity (guarding) and No Cutaneous hyperesthesia. Note: Abdomen obese  but soft. 1 cm umbilical hernia on Valsalva only. Supraumbilical swelling consistent with supraumbilical ventral hernia in the setting of diastasis recti. Sensitive 5 cm region. A 3 cm mass felt more deeply. Seems to be somewhat reducible.   Female Genitourinary Sexual Maturity Tanner 5 - Adult hair pattern. Note: No vaginal bleeding nor discharge   Peripheral Vascular Upper Extremity Inspection - Left - No Cyanotic nailbeds, Not Ischemic. Right - No Cyanotic nailbeds, Not Ischemic.  Neurologic Neurologic evaluation reveals -normal attention span and ability to concentrate, able to name objects and repeat phrases. Appropriate fund of knowledge , normal sensation and normal coordination. Mental Status Affect - not angry, not paranoid. Cranial Nerves-Normal Bilaterally. Gait-Normal.  Neuropsychiatric Mental status exam performed with findings of-able to articulate well with normal speech/language, rate, volume and coherence, thought content normal with ability to perform basic computations and apply abstract reasoning and no evidence of hallucinations, delusions, obsessions or homicidal/suicidal ideation.  Musculoskeletal Global Assessment Spine, Ribs and Pelvis - no instability, subluxation or laxity. Right Upper Extremity - no instability, subluxation or laxity.  Lymphatic Head & Neck  General Head & Neck Lymphatics: Bilateral - Description - No Localized lymphadenopathy. Axillary  General Axillary Region: Bilateral - Description - No Localized lymphadenopathy. Femoral & Inguinal  Generalized Femoral & Inguinal Lymphatics: Left - Description - No Localized lymphadenopathy. Right - Description - No Localized lymphadenopathy.    Assessment  & Plan Adin Hector MD; 78/29/5621 3:08 AM)  UMBILICAL HERNIA WITHOUT OBSTRUCTION AND WITHOUT GANGRENE (553.1  K42.9) Impression: Not massive but symptomatic. Would consider repair at the same time of the supraumbilical hernia.  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 I am skeptical primary repair would be sufficient in this setting.  SUPRAUMBILICAL HERNIA (657.8  K43.9) Impression: Obvious supraumbilical hernia on exam. Confirmed by CT scan. Seems to be fat at this point. Rather sensitive. Not fully reducible.  I think she would benefit from repair of the hernias. I would do a laparoscopic approach to map out the defects and do a good underlay repair since there are multiple locations. She is interested in proceeding. She seemed to be hoping to do it during winter break yet had to travel to [place] for Christmas. We will try and work to a mutually convenient time. Did discuss with surgery would entail and expected recovery. Also discussed warning signs for incarceration should something happen more emergently.  Current Plans Schedule for Surgery The anatomy & physiology of the abdominal wall was discussed. The pathophysiology of hernias was discussed. Natural history risks without surgery including progeressive enlargement, pain, incarceration, & strangulation was discussed. Contributors to complications such as smoking, obesity, diabetes, prior surgery, etc were discussed.  I feel the risks of no intervention will lead to serious problems that outweigh the operative risks; therefore, I recommended surgery to reduce and repair the hernia. I explained laparoscopic techniques with possible need for an open approach. I noted the probable use of mesh to patch and/or buttress the hernia repair  Risks such as bleeding, infection, abscess, need for further treatment, heart attack, death, and other risks were discussed. I noted a good likelihood this will help address the problem.  Goals of post-operative recovery were discussed as well. Possibility that this will not correct all symptoms was explained. I stressed the importance of low-impact activity, aggressive pain control, avoiding constipation, & not pushing through pain to minimize risk of post-operative chronic pain or injury. Possibility of reherniation especially with smoking, obesity, diabetes, immunosuppression, and other  health conditions was discussed. We will work to minimize complications.  An educational handout further explaining the pathology & treatment options was given as well. Questions were answered. The patient expresses understanding & wishes to proceed with surgery. Pt Education - [facility] Hernia Post-Op HCI (Sherrise Liberto): discussed with patient and provided information. Pt Education - [facility] Pain Control (Dailey Buccheri) CHRONIC CONSTIPATION (564.00  K59.00) Impression: I think she could benefit from a more aggressive bowel regimen to help her avoid further abdominal issues. No strong family GI history or major change in bowel habits, plan screening colonoscopy age 42.  Current Plans Pt Education - [facility] Good Bowel Health (Naomi Fitton)

## 2014-10-01 ENCOUNTER — Encounter (HOSPITAL_COMMUNITY): Payer: Self-pay

## 2014-10-01 ENCOUNTER — Encounter (HOSPITAL_COMMUNITY)
Admission: RE | Admit: 2014-10-01 | Discharge: 2014-10-01 | Disposition: A | Discharge status: Home / Self Care | Payer: BC Managed Care – PPO | Source: Ambulatory Visit | Attending: Surgery | Admitting: Surgery

## 2014-10-01 DIAGNOSIS — K439 Ventral hernia without obstruction or gangrene: Secondary | ICD-10-CM | POA: Insufficient documentation

## 2014-10-01 DIAGNOSIS — Z01818 Encounter for other preprocedural examination: Secondary | ICD-10-CM | POA: Diagnosis not present

## 2014-10-01 HISTORY — DX: Gastro-esophageal reflux disease without esophagitis: K21.9

## 2014-10-01 LAB — CBC
HCT: 36.8 % (ref 36.0–46.0)
HEMOGLOBIN: 11.7 g/dL — AB (ref 12.0–15.0)
MCH: 27.7 pg (ref 26.0–34.0)
MCHC: 31.8 g/dL (ref 30.0–36.0)
MCV: 87 fL (ref 78.0–100.0)
Platelets: 268 10*3/uL (ref 150–400)
RBC: 4.23 MIL/uL (ref 3.87–5.11)
RDW: 15.5 % (ref 11.5–15.5)
WBC: 7.8 10*3/uL (ref 4.0–10.5)

## 2014-10-01 LAB — HCG, SERUM, QUALITATIVE: PREG SERUM: NEGATIVE

## 2014-10-01 NOTE — Progress Notes (Signed)
CT abd pelvis in epic 08/18/2014

## 2014-10-01 NOTE — Patient Instructions (Signed)
[place]  10/01/2014   Your procedure is scheduled on:     Monday October 11, 2014   Report to [facility] Main Entrance and follow signs to  [place] arrive at [place].   Call this number if you have problems the morning of surgery [phone] or Presurgical Testing [phone].   Remember:  Do not eat food or drink liquids :After Midnight.  For Living Will and/or Health Care Power Attorney Forms: please provide copy for your medical record, may bring AM of surgery (forms should be already notarized-we do not provide this service).   Take these medicines the morning of surgery with A SIP OF WATER: NONE                               You may not have any metal on your body including hair pins and piercings  Do not wear jewelry, make-up, lotions, powders, prefumes or deodorant.  Do not shave body hair  48 hours(2 days) of CHG soap use.                Do not bring valuables to the hospital. [place].  Contacts, dentures or bridgework may not be worn into surgery.    Patients discharged the day of surgery will not be allowed to drive home.  Name and phone number of your driver:Ron Ruckers (boyfriend) ________________________________________________________________________  [facility] - Preparing for Surgery Before surgery, you can play an important role.  Because skin is not sterile, your skin needs to be as free of germs as possible.  You can reduce the number of germs on your skin by washing with CHG (chlorahexidine gluconate) soap before surgery.  CHG is an antiseptic cleaner which kills germs and bonds with the skin to continue killing germs even after washing. Please DO NOT use if you have an allergy to CHG or antibacterial soaps.  If your skin becomes reddened/irritated stop using the CHG and inform your nurse when you arrive at Short Stay. Do not shave (including legs and underarms) for at least 48 hours prior to the first  CHG shower.  You may shave your face/neck. Please follow these instructions carefully:  1.  Shower with CHG Soap the night before surgery and the  morning of Surgery.  2.  If you choose to wash your hair, wash your hair first as usual with your  normal  shampoo.  3.  After you shampoo, rinse your hair and body thoroughly to remove the  shampoo.                           4.  Use CHG as you would any other liquid soap.  You can apply chg directly  to the skin and wash                       Gently with a scrungie or clean washcloth.  5.  Apply the CHG Soap to your body ONLY FROM THE NECK DOWN.   Do not use on face/ open                           Wound or open sores. Avoid contact with eyes, ears mouth and genitals (private parts).  Wash face,  Genitals (private parts) with your normal soap.             6.  Wash thoroughly, paying special attention to the area where your surgery  will be performed.  7.  Thoroughly rinse your body with warm water from the neck down.  8.  DO NOT shower/wash with your normal soap after using and rinsing off  the CHG Soap.                9.  Pat yourself dry with a clean towel.            10.  Wear clean pajamas.            11.  Place clean sheets on your bed the night of your first shower and do not  sleep with pets. Day of Surgery : Do not apply any lotions/deodorants the morning of surgery.  Please wear clean clothes to the hospital/surgery center.  FAILURE TO FOLLOW THESE INSTRUCTIONS MAY RESULT IN THE CANCELLATION OF YOUR SURGERY PATIENT SIGNATURE_________________________________  NURSE SIGNATURE__________________________________  ________________________________________________________________________

## 2014-10-11 ENCOUNTER — Ambulatory Visit (HOSPITAL_COMMUNITY): Payer: BC Managed Care – PPO | Admitting: Anesthesiology

## 2014-10-11 ENCOUNTER — Encounter (HOSPITAL_COMMUNITY): Payer: Self-pay | Admitting: *Deleted

## 2014-10-11 ENCOUNTER — Ambulatory Visit (HOSPITAL_COMMUNITY)
Admission: RE | Admit: 2014-10-11 | Discharge: 2014-10-11 | Disposition: A | Discharge status: Home / Self Care | Payer: BC Managed Care – PPO | Source: Ambulatory Visit | Attending: Surgery | Admitting: Surgery

## 2014-10-11 ENCOUNTER — Encounter (HOSPITAL_COMMUNITY)
Admission: RE | Disposition: A | Discharge status: Home / Self Care | Payer: Self-pay | Source: Ambulatory Visit | Attending: Surgery

## 2014-10-11 DIAGNOSIS — Z79899 Other long term (current) drug therapy: Secondary | ICD-10-CM | POA: Insufficient documentation

## 2014-10-11 DIAGNOSIS — K439 Ventral hernia without obstruction or gangrene: Secondary | ICD-10-CM | POA: Insufficient documentation

## 2014-10-11 DIAGNOSIS — K429 Umbilical hernia without obstruction or gangrene: Secondary | ICD-10-CM | POA: Insufficient documentation

## 2014-10-11 DIAGNOSIS — K219 Gastro-esophageal reflux disease without esophagitis: Secondary | ICD-10-CM | POA: Insufficient documentation

## 2014-10-11 HISTORY — PX: UMBILICAL HERNIA REPAIR: SHX196

## 2014-10-11 HISTORY — PX: LAPAROSCOPIC ASSISTED VENTRAL HERNIA REPAIR: SHX6312

## 2014-10-11 SURGERY — REPAIR, HERNIA, VENTRAL, LAPAROSCOPY-ASSISTED
Anesthesia: General | Site: Abdomen

## 2014-10-11 MED ORDER — METOCLOPRAMIDE HCL 5 MG/ML IJ SOLN
INTRAMUSCULAR | Status: DC | PRN
  Administered 2014-10-11: 10 mg via INTRAVENOUS

## 2014-10-11 MED ORDER — 0.9 % SODIUM CHLORIDE (POUR BTL) OPTIME
TOPICAL | Status: DC | PRN
  Administered 2014-10-11: 1000 mL

## 2014-10-11 MED ORDER — ROCURONIUM BROMIDE 100 MG/10ML IV SOLN
INTRAVENOUS | Status: AC
Start: 2014-10-11 — End: 2014-10-11
  Filled 2014-10-11: qty 1

## 2014-10-11 MED ORDER — HYDROMORPHONE HCL 1 MG/ML IJ SOLN
INTRAMUSCULAR | Status: DC | PRN
  Administered 2014-10-11 (×4): 0.5 mg via INTRAVENOUS

## 2014-10-11 MED ORDER — LIDOCAINE HCL (CARDIAC) 20 MG/ML IV SOLN
INTRAVENOUS | Status: AC
  Filled 2014-10-11: qty 5

## 2014-10-11 MED ORDER — MIDAZOLAM HCL 2 MG/2ML IJ SOLN
INTRAMUSCULAR | Status: AC
  Filled 2014-10-11: qty 2

## 2014-10-11 MED ORDER — IBUPROFEN 600 MG PO TABS: 600.0000 mg | ORAL_TABLET | Freq: Four times a day (QID) | ORAL | Status: AC | PRN

## 2014-10-11 MED ORDER — PROPOFOL 10 MG/ML IV BOLUS
INTRAVENOUS | Status: DC | PRN
  Administered 2014-10-11: 170 mg via INTRAVENOUS
  Administered 2014-10-11: 20 mg via INTRAVENOUS

## 2014-10-11 MED ORDER — PROMETHAZINE HCL 25 MG/ML IJ SOLN: 6.2500 mg | INTRAMUSCULAR | Status: DC | PRN

## 2014-10-11 MED ORDER — BUPIVACAINE-EPINEPHRINE (PF) 0.25% -1:200000 IJ SOLN
INTRAMUSCULAR | Status: AC
Start: 2014-10-11 — End: 2014-10-11
  Filled 2014-10-11: qty 60

## 2014-10-11 MED ORDER — ONDANSETRON HCL 4 MG/2ML IJ SOLN
INTRAMUSCULAR | Status: DC | PRN
  Administered 2014-10-11: 4 mg via INTRAVENOUS

## 2014-10-11 MED ORDER — KETOROLAC TROMETHAMINE 30 MG/ML IJ SOLN
INTRAMUSCULAR | Status: AC
  Filled 2014-10-11: qty 1

## 2014-10-11 MED ORDER — LIDOCAINE HCL (CARDIAC) 20 MG/ML IV SOLN
INTRAVENOUS | Status: DC | PRN
  Administered 2014-10-11: 60 mg via INTRAVENOUS

## 2014-10-11 MED ORDER — CEFAZOLIN SODIUM-DEXTROSE 2-3 GM-% IV SOLR
INTRAVENOUS | Status: AC
  Filled 2014-10-11: qty 50

## 2014-10-11 MED ORDER — BUPIVACAINE 0.25 % ON-Q PUMP DUAL CATH 300 ML
300.0000 mL | INJECTION | Status: DC
  Filled 2014-10-11: qty 300

## 2014-10-11 MED ORDER — KETOROLAC TROMETHAMINE 30 MG/ML IJ SOLN
INTRAMUSCULAR | Status: DC | PRN
  Administered 2014-10-11: 30 mg via INTRAVENOUS

## 2014-10-11 MED ORDER — PROPOFOL 10 MG/ML IV BOLUS
INTRAVENOUS | Status: AC
  Filled 2014-10-11: qty 20

## 2014-10-11 MED ORDER — ROCURONIUM BROMIDE 100 MG/10ML IV SOLN
INTRAVENOUS | Status: DC | PRN
  Administered 2014-10-11: 10 mg via INTRAVENOUS
  Administered 2014-10-11: 40 mg via INTRAVENOUS

## 2014-10-11 MED ORDER — GLYCOPYRROLATE 0.2 MG/ML IJ SOLN
INTRAMUSCULAR | Status: DC | PRN
  Administered 2014-10-11: 0.4 mg via INTRAVENOUS

## 2014-10-11 MED ORDER — CEFAZOLIN SODIUM-DEXTROSE 2-3 GM-% IV SOLR
2.0000 g | INTRAVENOUS | Status: AC
  Administered 2014-10-11: 2 g via INTRAVENOUS

## 2014-10-11 MED ORDER — FENTANYL CITRATE 0.05 MG/ML IJ SOLN
INTRAMUSCULAR | Status: AC
  Filled 2014-10-11: qty 5

## 2014-10-11 MED ORDER — CHLORHEXIDINE GLUCONATE 4 % EX LIQD: 1.0000 "application " | Freq: Once | CUTANEOUS | Status: DC

## 2014-10-11 MED ORDER — ONDANSETRON HCL 4 MG/2ML IJ SOLN
INTRAMUSCULAR | Status: AC
Start: 2014-10-11 — End: 2014-10-11
  Filled 2014-10-11: qty 2

## 2014-10-11 MED ORDER — TRAMADOL HCL 50 MG PO TABS: 50.0000 mg | ORAL_TABLET | Freq: Four times a day (QID) | ORAL | Status: DC | PRN

## 2014-10-11 MED ORDER — LACTATED RINGERS IV SOLN
INTRAVENOUS | Status: DC | PRN
  Administered 2014-10-11 (×2): via INTRAVENOUS

## 2014-10-11 MED ORDER — STERILE WATER FOR IRRIGATION IR SOLN
Status: DC | PRN
  Administered 2014-10-11: 1500 mL

## 2014-10-11 MED ORDER — MIDAZOLAM HCL 5 MG/5ML IJ SOLN
INTRAMUSCULAR | Status: DC | PRN
  Administered 2014-10-11: 1 mg via INTRAVENOUS

## 2014-10-11 MED ORDER — FENTANYL CITRATE 0.05 MG/ML IJ SOLN
INTRAMUSCULAR | Status: DC | PRN
  Administered 2014-10-11 (×2): 50 ug via INTRAVENOUS
  Administered 2014-10-11: 100 ug via INTRAVENOUS

## 2014-10-11 MED ORDER — BUPIVACAINE-EPINEPHRINE 0.25% -1:200000 IJ SOLN
INTRAMUSCULAR | Status: DC | PRN
  Administered 2014-10-11: 60 mL

## 2014-10-11 MED ORDER — HYDROMORPHONE HCL 1 MG/ML IJ SOLN: 0.2500 mg | INTRAMUSCULAR | Status: DC | PRN

## 2014-10-11 MED ORDER — DEXAMETHASONE SODIUM PHOSPHATE 10 MG/ML IJ SOLN
INTRAMUSCULAR | Status: DC | PRN
  Administered 2014-10-11: 10 mg via INTRAVENOUS

## 2014-10-11 MED ORDER — HYDROMORPHONE HCL 2 MG/ML IJ SOLN
INTRAMUSCULAR | Status: AC
  Filled 2014-10-11: qty 1

## 2014-10-11 MED ORDER — NEOSTIGMINE METHYLSULFATE 10 MG/10ML IV SOLN
INTRAVENOUS | Status: DC | PRN
  Administered 2014-10-11: 3 mg via INTRAVENOUS

## 2014-10-11 SURGICAL SUPPLY — 45 items
APPLIER CLIP 5 13 M/L LIGAMAX5 (MISCELLANEOUS)
BINDER ABDOMINAL 12 ML 46-62 (SOFTGOODS) IMPLANT
CABLE HI FREQUENCY MONOPOLAR (ELECTROSURGICAL) ×3 IMPLANT
CATH KIT ON-Q SILVERSOAK 7.5IN (CATHETERS) ×6 IMPLANT
CHLORAPREP W/TINT 26ML (MISCELLANEOUS) ×3 IMPLANT
CLIP APPLIE 5 13 M/L LIGAMAX5 (MISCELLANEOUS) IMPLANT
CLOSURE WOUND 1/2 X4 (GAUZE/BANDAGES/DRESSINGS) ×4
DECANTER SPIKE VIAL GLASS SM (MISCELLANEOUS) IMPLANT
DEVICE SECURE STRAP 25 ABSORB (INSTRUMENTS) ×6 IMPLANT
DEVICE TROCAR PUNCTURE CLOSURE (ENDOMECHANICALS) ×3 IMPLANT
DRAPE LAPAROSCOPIC ABDOMINAL (DRAPES) ×3 IMPLANT
DRAPE WARM FLUID 44X44 (DRAPE) ×3 IMPLANT
DRSG TEGADERM 2-3/8X2-3/4 SM (GAUZE/BANDAGES/DRESSINGS) ×9 IMPLANT
DRSG TEGADERM 4X4.75 (GAUZE/BANDAGES/DRESSINGS) ×3 IMPLANT
ELECT REM PT RETURN 9FT ADLT (ELECTROSURGICAL) ×3
ELECTRODE REM PT RTRN 9FT ADLT (ELECTROSURGICAL) ×1 IMPLANT
GAUZE SPONGE 2X2 8PLY STRL LF (GAUZE/BANDAGES/DRESSINGS) ×1 IMPLANT
GLOVE BIOGEL PI IND STRL 6.5 (GLOVE) ×1 IMPLANT
GLOVE BIOGEL PI IND STRL 7.0 (GLOVE) ×1 IMPLANT
GLOVE BIOGEL PI INDICATOR 6.5 (GLOVE) ×2
GLOVE BIOGEL PI INDICATOR 7.0 (GLOVE) ×2
GLOVE ECLIPSE 7.0 STRL STRAW (GLOVE) ×3 IMPLANT
GLOVE ECLIPSE 8.0 STRL XLNG CF (GLOVE) ×3 IMPLANT
GLOVE INDICATOR 8.0 STRL GRN (GLOVE) ×3 IMPLANT
GLOVE SURG SS PI 6.5 STRL IVOR (GLOVE) ×6 IMPLANT
GOWN STRL REUS W/TWL XL LVL3 (GOWN DISPOSABLE) ×9 IMPLANT
KIT BASIN OR (CUSTOM PROCEDURE TRAY) ×3 IMPLANT
MESH VENTRALIGHT ST 8X10 (Mesh General) ×3 IMPLANT
NEEDLE SPNL 22GX3.5 QUINCKE BK (NEEDLE) ×3 IMPLANT
PEN SKIN MARKING BROAD (MISCELLANEOUS) ×3 IMPLANT
SCISSORS LAP 5X35 DISP (ENDOMECHANICALS) ×3 IMPLANT
SET IRRIG TUBING LAPAROSCOPIC (IRRIGATION / IRRIGATOR) IMPLANT
SHEARS HARMONIC ACE PLUS 36CM (ENDOMECHANICALS) ×3 IMPLANT
SLEEVE XCEL OPT CAN 5 100 (ENDOMECHANICALS) ×6 IMPLANT
SPONGE GAUZE 2X2 STER 10/PKG (GAUZE/BANDAGES/DRESSINGS) ×2
STRIP CLOSURE SKIN 1/2X4 (GAUZE/BANDAGES/DRESSINGS) ×8 IMPLANT
SUT MNCRL AB 4-0 PS2 18 (SUTURE) ×3 IMPLANT
SUT PDS AB 1 CT1 27 (SUTURE) ×9 IMPLANT
SUT PROLENE 1 CT 1 30 (SUTURE) ×24 IMPLANT
TOWEL OR 17X26 10 PK STRL BLUE (TOWEL DISPOSABLE) ×3 IMPLANT
TRAY FOLEY CATH 14FRSI W/METER (CATHETERS) IMPLANT
TRAY LAPAROSCOPIC (CUSTOM PROCEDURE TRAY) ×3 IMPLANT
TROCAR BLADELESS OPT 5 100 (ENDOMECHANICALS) ×3 IMPLANT
TROCAR XCEL NON-BLD 11X100MML (ENDOMECHANICALS) IMPLANT
TUNNELER SHEATH ON-Q 16GX12 DP (PAIN MANAGEMENT) ×3 IMPLANT

## 2014-10-11 NOTE — Transfer of Care (Signed)
Immediate Anesthesia Transfer of Care Note  Patient: [name]  Procedure(s) Performed: Procedure(s): LAPAROSCOPIC ASSISTED VENTRAL WALL HERNIA REPAIR (N/A) HERNIA REPAIR UMBILICAL ADULT (N/A)  Patient Location: PACU  Anesthesia Type:General  Level of Consciousness: awake, alert , oriented and patient cooperative  Airway & Oxygen Therapy: Patient Spontanous Breathing and Patient connected to face mask oxygen  Post-op Assessment: Report given to RN and Post -op Vital signs reviewed and stable  Post vital signs: Reviewed and stable  Last Vitals:  Filed Vitals:   10/11/14 0530  BP: 115/55  Pulse: 76  Temp: 37.2 C  Resp: 18    Complications: No apparent anesthesia complications

## 2014-10-11 NOTE — Anesthesia Preprocedure Evaluation (Addendum)
Anesthesia Evaluation  Patient identified by MRN, date of birth, ID band Patient awake    Reviewed: Allergy & Precautions, NPO status , Patient's Chart, lab work & pertinent test results  History of Anesthesia Complications Negative for: history of anesthetic complications  Airway Mallampati: II  TM Distance: >3 FB Neck ROM: Full    Dental  (+) Teeth Intact, Dental Advisory Given   Pulmonary neg pulmonary ROS,    Pulmonary exam normal       Cardiovascular negative cardio ROS      Neuro/Psych negative neurological ROS  negative psych ROS   GI/Hepatic Neg liver ROS, GERD-  ,  Endo/Other  negative endocrine ROS  Renal/GU negative Renal ROS     Musculoskeletal   Abdominal   Peds  Hematology   Anesthesia Other Findings   Reproductive/Obstetrics                            Anesthesia Physical Anesthesia Plan  ASA: II  Anesthesia Plan: General   Post-op Pain Management:    Induction: Intravenous  Airway Management Planned: Oral ETT  Additional Equipment:   Intra-op Plan:   Post-operative Plan: Extubation in OR  Informed Consent: I have reviewed the patients History and Physical, chart, labs and discussed the procedure including the risks, benefits and alternatives for the proposed anesthesia with the patient or authorized representative who has indicated his/her understanding and acceptance.   Dental advisory given  Plan Discussed with: CRNA, Anesthesiologist and Surgeon  Anesthesia Plan Comments:        Anesthesia Quick Evaluation

## 2014-10-11 NOTE — Progress Notes (Signed)
Pt hs 14 port sites, covered with gauze and under abdominal binder. Scant amt blood colored drainage from site of ON-QUE entry.

## 2014-10-11 NOTE — Op Note (Signed)
10/11/2014  9:27 AM  PATIENT:  [name]  44 y.o. female  Patient Care Team: Aretta Nip, MD as PCP - General (Family Medicine) Aretta Nip, MD as Consulting Physician (Family Medicine) Ladene Artist, MD as Consulting Physician (Gastroenterology) [name], NP as Nurse Practitioner (Obstetrics and Gynecology)  PRE-OPERATIVE DIAGNOSIS:  Periumbilical Abdominal Wall Hernia  POST-OPERATIVE DIAGNOSIS:  Periumbilical Abdominal Wall Hernia  PROCEDURE:  Procedure(s): LAPAROSCOPIC ASSISTED VENTRAL WALL HERNIA REPAIR HERNIA REPAIR UMBILICAL ADULT  SURGEON:  Surgeon(s): Michael Boston, MD  ASSISTANT: RN   ANESTHESIA:   local and general  EBL:  Total I/O In: 1000 [I.V.:1000] Out: -   Delay start of Pharmacological VTE agent (>24hrs) due to surgical blood loss or risk of bleeding:  no  DRAINS: none   SPECIMEN:  No Specimen  DISPOSITION OF SPECIMEN:  N/A  COUNTS:  YES  PLAN OF CARE: Discharge to home after PACU  PATIENT DISPOSITION:  PACU - hemodynamically stable.  INDICATION: Pleasant patient has developed a ventral wall abdominal hernia.   Recommendation was made for surgical repair:   The anatomy & physiology of the abdominal wall was discussed. The pathophysiology of hernias was discussed. Natural history risks without surgery including progeressive enlargement, pain, incarceration & strangulation was discussed. Contributors to complications such as smoking, obesity, diabetes, prior surgery, etc were discussed.  I feel the risks of no intervention will lead to serious problems that outweigh the operative risks; therefore, I recommended surgery to reduce and repair the hernia. I explained laparoscopic techniques with possible need for an open approach. I noted the probable use of mesh to patch and/or buttress the hernia repair  Risks such as bleeding, infection, abscess, need for further treatment, heart attack, death, and other risks were discussed. I  noted a good likelihood this will help address the problem. Goals of post-operative recovery were discussed as well. Possibility that this will not correct all symptoms was explained. I stressed the importance of low-impact activity, aggressive pain control, avoiding constipation, & not pushing through pain to minimize risk of post-operative chronic pain or injury. Possibility of reherniation especially with smoking, obesity, diabetes, immunosuppression, and other health conditions was discussed. We will work to minimize complications.  An educational handout further explaining the pathology & treatment options was given as well. Questions were answered. The patient expresses understanding & wishes to proceed with surgery.   OR FINDINGS: Swiss cheese type hernias and epigastric and supraumbilical region with 2.5 cm umbilical hernia as well.  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  Type of repair - Laparoscopic underlay repair   Name of mesh -  Bard Ventralight dual sided (polypropylene / Seprafilm)  Size of mesh - Length 25 cm, Width 20 cm  Mesh overlap - 5-7 cm  Placement of mesh - Intraperitoneal underlay repair   DESCRIPTION:   Informed consent was confirmed. The patient underwent general anaesthesia without difficulty. The patient was positioned appropriately. VTE prevention in place. The patient's abdomen was clipped, prepped, & draped in a sterile fashion. Surgical timeout confirmed our plan.  The patient was positioned in reverse Trendelenburg. Abdominal entry was gained using optical entry technique in the left upper abdomen. Entry was clean. I induced carbon dioxide insufflation. Camera inspection revealed no injury. Extra ports were carefully placed under direct laparoscopic visualization.   I could see the hernia in the central abdomen.   I did laparoscopic lysis of adhesions to free the falciform ligament from the xiphoid down  to the umbilicus.  This exposed a surprising number of  1-2 cm hernias in Swiss cheese fashion with preperitoneal fat incarcerated within it.  There were 5 supraumbilically not to include the umbilical hernia itself.  Region mapped out 15 cm long and 5 cm wide.  Because of this, I chose a larger mesh than initially anticipated.  To ensure that I would have at least 5 cm radial coverage outside of the hernia defect, I chose a 25x20 cm dual sided mesh.  I placed #1 Prolene stitches around its edge about every 5 cm = 14 total.  I rolled the mesh & placed into the peritoneal cavity through the umbilical hernia fascial defect.  I unrolled  the mesh and positioned it appropriately.  I secured the mesh to cover up the hernia defect using a laparoscopic suture passer to pass the tails of the Prolene through the abdominal wall & tagged them with clamps.  I started out in four corners to make sure I had the mesh centered over the hernia defect appropriately, and then proceeded to work in quadrants.  We evacuated CO2 & desufflated the abdomen.  I tied the fascial stitches down.  I reinsufflated the abdomen.  The mesh provided at least 5-10 cm circumferential coverage around the entire region of hernia defects.   I tacked the edges & central part of the mesh to the peritoneum/posterior rectus fascia with  SecureStrap absorbable tacks.   Hemostasis was excellent.  I closed the umbilical hernia transversely using interrupted #1 PDS sutures.  I did use a PDS suture to help tack the mesh centrally 2 locations. I also used a 0 Vicryl in a re-tacked the umbilical stalk to the central part of the closure.  Diagnostic laparoscopy converted no injury or other abnormalities.  Under direct laparoscopic visualization, I then placed On-Q catheter sheaths in the preperitoneal plane under direct laparoscopic guidance from the subxiphoid region along the subcostal ridge down to the posterior iliac crests in the lower flanks. I did reinspection. Hemostasis was good. Mesh laid well.  Capnoperitoneum was evacuated. Ports were removed. The skin was closed with Monocryl at the port sites and Steri-Strips on the fascial stitch puncture sites. OnQ catheters were placed and the sheathes peeled away. On-Q pump was secured. Patient is being extubated to go to the recovery room. I'm about discussed operative findings with the patient's family.   Adin Hector, M.D., F.A.C.S. Gastrointestinal and Minimally Invasive Surgery [facility] [address] [place],  [address] [phone] Main / Paging

## 2014-10-11 NOTE — H&P (Signed)
[name]  [name] [place], [place] [address] Phone: [phone] FAX: [place]  08-08-1971 [id number]  CARE TEAM:  PCP: Andrea Evener, MD  Outpatient Care Team: Patient Care Team: Andrea Nip, MD as PCP - General (Family Medicine) Andrea Nip, MD as Consulting Physician (Family Medicine) Andrea Artist, MD as Consulting Physician (Gastroenterology) Andrea Cote, NP as Nurse Practitioner (Obstetrics and Gynecology)  Inpatient Treatment Team: Treatment Team: Attending Provider: Michael Boston, MD  This patient is a 44 y.o.female who presents today for surgical evaluation   Reason for evaluation: Hernia  The patient is a 44 year old female who presents with an incisional hernia. Patient sent for surgical consultation by [name], nurse practitioner, [facility] for concerns of possible abdominal wall hernia.  Pleasant overweight female with known history of periumbilical ventral hernia. Was seen by my partner, [name] in 2013. Umbilical hernia was minimally symptomatic. Mild diastases recti. Observation offered.  Patient notes she feels the area is larger and more sensitive. Underwent workup for abdominal pain. Ultrasound revealed questionable renal mass. Follow-up CT scan revealed no renal mass. Small periumbilical ventral hernias 2 can be seen. Patient tends to be constipated with 2 bowel movements a week. Had 2 C-sections done years ago. She can walk okay. She does get intermittent sharp pains especially when lifting her "grandbaby" and twisting. She does have a history of chronic heartburn/reflux followed by [name] with gastroenterology. Appears to be controlled with Prilosec. No major bleeding problems. Can tolerate most foods. No evidences of nausea vomiting or bloating. She is relatively active. 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 No history of skin  infections.  No new events.  No episodes of incarceration.  Past Medical History  Diagnosis Date  . Esophagitis   . GERD (gastroesophageal reflux disease)     Past Surgical History  Procedure Laterality Date  . Cesarean section      History   Social History  . Marital Status: Single    Spouse Name: N/A    Number of Children: N/A  . Years of Education: N/A   Occupational History  . Not on file.   Social History Main Topics  . Smoking status: Never Smoker   . Smokeless tobacco: Never Used  . Alcohol Use: Yes     Comment: rare/ socially  . Drug Use: No  . Sexual Activity: Yes    Birth Control/ Protection: Other-see comments     Comment: Vasectomy   Other Topics Concern  . Not on file   Social History Narrative    Family History  Problem Relation Age of Onset  . COPD Mother   . Hypertension Mother   . Hypertension Father   . Breast cancer Maternal Aunt     after menopause  . Cancer Maternal Aunt     breast  . Cancer Maternal Grandmother     ovarian or cervical    Current Facility-Administered Medications  Medication Dose Route Frequency Provider Last Rate Last Dose  . ceFAZolin (ANCEF) IVPB 2 g/50 mL premix  2 g Intravenous On Call to OR Andrea Boston, MD      . Derrill Memo ON 10/12/2014] chlorhexidine (HIBICLENS) 4 % liquid 1 application  1 application Topical Once Andrea Boston, MD      . chlorhexidine (HIBICLENS) 4 % liquid 1 application  1 application Topical Once Andrea Boston, MD  Allergies  Allergen Reactions  . Percocet [Oxycodone-Acetaminophen] Hives    ROS: Constitutional:  No fevers, chills, sweats.  Weight stable Eyes:  No vision changes, No discharge HENT:  No sore throats, nasal drainage Lymph: No neck swelling, No bruising easily Pulmonary:  No cough, productive sputum CV: No orthopnea, PND  No exertional chest/neck/shoulder/arm pain. GI: No personal nor family history of GI/colon cancer, inflammatory bowel disease, irritable bowel  syndrome, allergy such as Celiac Sprue, dietary/dairy problems, colitis, ulcers nor gastritis.  No recent sick contacts/gastroenteritis.  No travel outside the country.  No changes in diet. Renal: No UTIs, No hematuria Genital:  No drainage, bleeding, masses Musculoskeletal: No severe joint pain.  Good ROM major joints Skin:  No sores or lesions.  No rashes Heme/Lymph:  No easy bleeding.  No swollen lymph nodes Neuro: No focal weakness/numbness.  No seizures Psych: No suicidal ideation.  No hallucinations  BP 115/55 mmHg  Pulse 76  Temp(Src) 99 F (37.2 C) (Oral)  Resp 18  Ht 5' (1.524 m)  Wt 146 lb 8 oz (66.452 kg)  BMI 28.61 kg/m2  SpO2 100%  Physical Exam: General: Pt awake/alert/oriented x4 in no major acute distress Eyes: PERRL, normal EOM. Sclera nonicteric Neuro: CN II-XII intact w/o focal sensory/motor deficits. Lymph: No head/neck/groin lymphadenopathy Psych:  No delerium/psychosis/paranoia HENT: Normocephalic, Mucus membranes moist.  No thrush Neck: Supple, No tracheal deviation Chest: No pain.  Good respiratory excursion. CV:  Pulses intact.  Regular rhythm  Abdomen obese but soft. 1 cm umbilical hernia on Valsalva only. Supraumbilical swelling consistent with supraumbilical ventral hernia in the setting of diastasis recti. Sensitive 5 cm region. A 3 cm mass felt more deeply. Seems to be somewhat reducible.  Ext:  SCDs BLE.  No significant edema.  No cyanosis Skin: No petechiae / purpurea.  No major sores Musculoskeletal: No severe joint pain.  Good ROM major joints   Results:   Labs: No results found for this or any previous visit (from the past 48 hour(s)).  Imaging / Studies: No results found.  Medications / Allergies: per chart  Antibiotics: Anti-infectives    Start     Dose/Rate Route Frequency Ordered Stop   10/11/14 0602  ceFAZolin (ANCEF) IVPB 2 g/50 mL premix     2 g100 mL/hr over 30 Minutes Intravenous On call to O.R. 10/11/14 0602 10/12/14 0559       Assessment  [name]  44 y.o. female  Day of Surgery  Procedure(s): LAPAROSCOPIC ASSISTED VENTRAL WALL HERNIA REPAIR  Problem List:  Active Problems:   * No active hospital problems. *   Hernia  Harrell:  Obvious supraumbilical hernia on exam. Confirmed by CT scan. Seems to be fat at this point. Rather sensitive. Not fully reducible.  I think she would benefit from repair of the hernias. I would do a laparoscopic approach to map out the defects and do a good underlay repair since there are multiple locations. She is interested in proceeding. She seemed to be hoping to do it during winter break yet had to travel to [place] for Christmas. We will try and work to a mutually convenient time. Did discuss with surgery would entail and expected recovery. Also discussed warning signs for incarceration should something happen more emergently. Current Plans  Schedule for Surgery The anatomy & physiology of the abdominal wall was discussed. The pathophysiology of hernias was discussed. Natural history risks without surgery including progeressive enlargement, pain, incarceration, & strangulation was discussed. Contributors to complications such  as smoking, obesity, diabetes, prior surgery, etc were discussed.  I feel the risks of no intervention will lead to serious problems that outweigh the operative risks; therefore, I recommended surgery to reduce and repair the hernia. I explained laparoscopic techniques with possible need for an open approach. I noted the probable use of mesh to patch and/or buttress the hernia repair  Risks such as bleeding, infection, abscess, need for further treatment, heart attack, death, and other risks were discussed. I noted a good likelihood this will help address the problem. Goals of post-operative recovery were discussed as well. Possibility that this will not correct all symptoms was explained. I stressed the importance of low-impact activity, aggressive  pain control, avoiding constipation, & not pushing through pain to minimize risk of post-operative chronic pain or injury. Possibility of reherniation especially with smoking, obesity, diabetes, immunosuppression, and other health conditions was discussed. We will work to minimize complications.  An educational handout further explaining the pathology & treatment options was given as well. Questions were answered. The patient expresses understanding & wishes to proceed with surgery.  -VTE prophylaxis- SCDs, etc -mobilize as tolerated to help recovery    Adin Hector, M.D., F.A.C.S. Gastrointestinal and Minimally Invasive Surgery [facility] [address], [place], [place] [address] [phone] Main / Paging   10/11/2014  Note: Portions of this report may have been transcribed using voice recognition software. Every effort was made to ensure accuracy; however, inadvertent computerized transcription errors may be present.   Any transcriptional errors that result from this process are unintentional.

## 2014-10-11 NOTE — Anesthesia Postprocedure Evaluation (Signed)
Anesthesia Post Note  Patient: [name]  Procedure(s) Performed: Procedure(s) (LRB): LAPAROSCOPIC ASSISTED VENTRAL WALL HERNIA REPAIR (N/A) HERNIA REPAIR UMBILICAL ADULT (N/A)  Anesthesia type: general  Patient location: PACU  Post pain: Pain level controlled  Post assessment: Patient's Cardiovascular Status Stable  Last Vitals:  Filed Vitals:   10/11/14 1000  BP: 102/62  Pulse: 77  Temp:   Resp: 17    Post vital signs: Reviewed and stable  Level of consciousness: sedated  Complications: No apparent anesthesia complications

## 2014-10-11 NOTE — Discharge Instructions (Signed)
Patient Education Sheet  °ON-Q Pain Relief System  ° °What is the ON-Q?  °The ON-Q is a balloon-type pump filled with a medicine to treat your pain. It blocks the pain in the area of your procedure. With ON-Q, you may have better pain relief and need less pain medicine. Its small size allows you to move around freely.  °How does the ON-Q work?  °The pump is attached to a catheter (small tube) near your procedure site. The pump automatically pushes the medicine through the tube. DO NOT squeeze the pump. The pump may be clipped to your clothing or dressing or placed in a small carrying case.  ° °How do I know the pump is working?  °The pump gives your medicine very slowly. It may take longer than 24 hours after your procedure to notice a change in the size and look of the pump. Your pump may look like the picture.  °In time, the outside bag on the pump will get looser and wrinkles will form in the bag.  °Talk to your doctor about taking other pain medication as needed.  °Check the following:  °o Make sure the white clamp on the tubing is open (moves freely on the tubing).  °o Make sure there are no kinks in the pump tubing.  °o Do not tape or cover the filter.  ° °What should I do with my ON-Q when sleeping?  °Make sure the pump is placed on a bedside table or on top of bed covers.  °Do not place the pump underneath the bed covers where the pump may become too warm.  °Do not place the pump on the floor or hang the pump on a bedpost.  ° °Can I take a shower with ON-Q?  °Your doctor will tell you when it’s ok for you to shower.  °Take care to protect the catheter site, pump and filter from water.  °Do not submerge the pump in water.  ° °How long will my ON-Q last?  °Depending on the size of your pump, it may take 4-5 days to give you all the medicine. All your medicine has been delivered when the outside bag is flat and a hard tube can be felt in the middle of the pump.  ° °Troubleshooting  °Tubing Disconnection  °If  the pump tubing becomes disconnected from your catheter, Remove all dressings and catheters and throw the On-Q pump away ° °Leaking  °If leaking from the pump or pump tubing occurs, then remove all dressings and catheters and throw the On-Q pump away °  °When should I call the surgeon?  °Be aware that you may experience loss of feeling (numbness) at and around the area of your procedure. If this occurs, be careful not to hurt yourself. Be careful when placing hot or cold items on the numb area.  °The following symptoms may represent a serious medical condition.  °Immediately close the clamp on the pump tubing and call your doctor or 911 in case of emergency.  °Increase in pain  °Fever, chills, sweats  °Bowel or bladder changes  °Difficulty breathing  °Redness, warmth, or discharge or excessive bleeding from the catheter site  °Dizziness or lightheadedness  °Blurred vision  °Ringing or buzzing in your ears  °Metal taste in your mouth  °Numbness or tingling around your mouth, fingers, or toes  °Drowsiness  °Confusion  ° °How to remove the ON-Q catheter?  °If your doctor has instructed you to remove the ON-Q catheter, follow   their instructions keeping in mind these steps:  -Remove the dressing covering the catheter site.  -Remove any skin adhesive strips.  -Grasp the catheter close to the skin and gently pull on the catheter. -It should be easy to remove and not painful.  -If it becomes difficult STOP and call your doctor.  -Do not cut or pull hard to remove the catheter.  -Once removed, check the catheter tip for a black marking to ensure the entire catheter was removed. Call your doctor if you do not see the black marking.  Place bandage over the catheter site as instructed by your doctor.   Source: [employer], ON-Q, and [employer] are registered trademarks and [employer] and [employer] are trademarks of [employer].  2011 [employer]. All right reserved.  10/     HERNIA REPAIR:  POST OP INSTRUCTIONS  1. DIET: Follow a light bland diet the first 24 hours after arrival home, such as soup, liquids, crackers, etc.  Be sure to include lots of fluids daily.  Avoid fast food or heavy meals as your are more likely to get nauseated.  Eat a low fat the next few days after surgery. 2. Take your usually prescribed home medications unless otherwise directed. 3. PAIN CONTROL: a. Pain is best controlled by a usual combination of three different methods TOGETHER: i. Ice/Heat ii. Over the counter pain medication iii. Prescription pain medication b. Most patients will experience some swelling and bruising around the hernia(s) such as the bellybutton, groins, or old incisions.  Ice packs or heating pads (30-60 minutes up to 6 times a day) will help. Use ice for the first few days to help decrease swelling and bruising, then switch to heat to help relax tight/sore spots and speed recovery.  Some people prefer to use ice alone, heat alone, alternating between ice & heat.  Experiment to what works for you.  Swelling and bruising can take several weeks to resolve.   c. It is helpful to take an over-the-counter pain medication regularly for the first few weeks.  Choose one of the following that works best for you: i. Naproxen (Aleve, etc)  Two 220mg  tabs twice a day ii. Ibuprofen (Advil, etc) Three 200mg  tabs four times a day (every meal & bedtime) iii. Acetaminophen (Tylenol, etc) 325-650mg  four times a day (every meal & bedtime) d. A  prescription for pain medication should be given to you upon discharge.  Take your pain medication as prescribed.  i. If you are having problems/concerns with the prescription medicine (does not control pain, nausea, vomiting, rash, itching, etc), please call us [phone] to see if we need to switch you to a different pain medicine that will work better for you and/or control your side effect better. ii. If you need a refill on your pain medication, please  contact your pharmacy.  They will contact our office to request authorization. Prescriptions will not be filled after 5 pm or on week-ends. 4. Avoid getting constipated.  Between the surgery and the pain medications, it is common to experience some constipation.  Increasing fluid intake and taking a fiber supplement (such as Metamucil, Citrucel, FiberCon, MiraLax, etc) 1-2 times a day regularly will usually help prevent this problem from occurring.  A mild laxative (prune juice, Milk of Magnesia, MiraLax, etc) should be taken according to package directions if there are no bowel movements after 48 hours.   5. Wash / shower every day.  You may shower over the dressings as they are  waterproof.   6. Remove your waterproof bandages 5 days after surgery.  You may leave the incision open to air.  You may replace a dressing/Band-Aid to cover the incision for comfort if you wish.  Continue to shower over incision(s) after the dressing is off.    7. ACTIVITIES as tolerated:   a. You may resume regular (light) daily activities beginning the next day--such as daily self-care, walking, climbing stairs--gradually increasing activities as tolerated.  If you can walk 30 minutes without difficulty, it is safe to try more intense activity such as jogging, treadmill, bicycling, low-impact aerobics, swimming, etc. b. Save the most intensive and strenuous activity for last such as sit-ups, heavy lifting, contact sports, etc  Refrain from any heavy lifting or straining until you are off narcotics for pain control.   c. DO NOT PUSH THROUGH PAIN.  Let pain be your guide: If it hurts to do something, don't do it.  Pain is your body warning you to avoid that activity for another week until the pain goes down. d. You may drive when you are no longer taking prescription pain medication, you can comfortably wear a seatbelt, and you can safely maneuver your car and apply brakes. e. Dennis Bast may have sexual intercourse when it is  comfortable.  8. FOLLOW UP in our office a. Please call [facility] at [phone] to set up an appointment to see your surgeon in the office for a follow-up appointment approximately 2-3 weeks after your surgery. b. Make sure that you call for this appointment the day you arrive home to insure a convenient appointment time. 9.  IF YOU HAVE DISABILITY OR FAMILY LEAVE FORMS, BRING THEM TO THE OFFICE FOR PROCESSING.  DO NOT GIVE THEM TO YOUR DOCTOR.  WHEN TO CALL us [phone]: 1. Poor pain control 2. Reactions / problems with new medications (rash/itching, nausea, etc)  3. Fever over 101.5 F (38.5 C) 4. Inability to urinate 5. Nausea and/or vomiting 6. Worsening swelling or bruising 7. Continued bleeding from incision. 8. Increased pain, redness, or drainage from the incision   The clinic staff is available to answer your questions during regular business hours (8:30am-5pm).  Please dont hesitate to call and ask to speak to one of our nurses for clinical concerns.   If you have a medical emergency, go to the nearest emergency room or call 911.  A surgeon from [facility] is always on call at the hospitals in [facility], [place], [place], [place], [place]  [address] ?  [address] [address], [place], [place]   [address] MAIN: [id number] ? TOLL FREE: [phone] ? FAX: [phone] [email or web address]  Managing Pain  Pain after surgery or related to activity is often due to strain/injury to muscle, tendon, nerves and/or incisions.  This pain is usually short-term and will improve in a few months.   Many people find it helpful to do the following things TOGETHER to help speed the process of healing and to get back to regular activity more quickly:  1. Avoid heavy physical activity a.  no lifting greater than 20 pounds b. Do not push through the pain.  Listen to your body and avoid positions and maneuvers than reproduce the  pain c. Walking is okay as tolerated, but go slowly and stop when getting sore.  d. Remember: If it hurts to do it, then dont do it! 2. Take Anti-inflammatory medication  a. Take with food/snack around the clock  for 1-2 weeks i. This helps the muscle and nerve tissues become less irritable and calm down faster b. Choose ONE of the following over-the-counter medications: i. Naproxen 220mg  tabs (ex. Aleve) 1-2 pills twice a day  ii. Ibuprofen 200mg  tabs (ex. Advil, Motrin) 3-4 pills with every meal and just before bedtime iii. Acetaminophen 500mg  tabs (Tylenol) 1-2 pills with every meal and just before bedtime 3. Use a Heating pad or Ice/Cold Pack a. 4-6 times a day b. May use warm bath/hottub  or showers 4. Try Gentle Massage and/or Stretching  a. at the area of pain many times a day b. stop if you feel pain - do not overdo it  Try these steps together to help you body heal faster and avoid making things get worse.  Doing just one of these things may not be enough.    If you are not getting better after two weeks or are noticing you are getting worse, contact our office for further advice; we may need to re-evaluate you & see what other things we can do to help.  GETTING TO GOOD BOWEL HEALTH. Irregular bowel habits such as constipation and diarrhea can lead to many problems over time.  Having one soft bowel movement a day is the most important way to prevent further problems.  The anorectal canal is designed to handle stretching and feces to safely manage our ability to get rid of solid waste (feces, poop, stool) out of our body.  BUT, hard constipated stools can act like ripping concrete bricks and diarrhea can be a burning fire to this very sensitive area of our body, causing inflamed hemorrhoids, anal fissures, increasing risk is perirectal abscesses, abdominal pain/bloating, an making irritable bowel worse.     The goal: ONE SOFT BOWEL MOVEMENT A DAY!  To have soft, regular bowel  movements:   Drink at least 8 tall glasses of water a day.    Take plenty of fiber.  Fiber is the undigested part of plant food that passes into the colon, acting s natures broom to encourage bowel motility and movement.  Fiber can absorb and hold large amounts of water. This results in a larger, bulkier stool, which is soft and easier to pass. Work gradually over several weeks up to 6 servings a day of fiber (25g a day even more if needed) in the form of: o Vegetables -- Root (potatoes, carrots, turnips), leafy green (lettuce, salad greens, celery, spinach), or cooked high residue (cabbage, broccoli, etc) o Fruit -- Fresh (unpeeled skin & pulp), Dried (prunes, apricots, cherries, etc ),  or stewed ( applesauce)  o Whole grain breads, pasta, etc (whole wheat)  o Bran cereals   Bulking Agents -- This type of water-retaining fiber generally is easily obtained each day by one of the following:  o Psyllium bran -- The psyllium plant is remarkable because its ground seeds can retain so much water. This product is available as Metamucil, Konsyl, Effersyllium, Per Diem Fiber, or the less expensive generic preparation in drug and health food stores. Although labeled a laxative, it really is not a laxative.  o Methylcellulose -- This is another fiber derived from wood which also retains water. It is available as Citrucel. o Polyethylene Glycol - and artificial fiber commonly called Miralax or Glycolax.  It is helpful for people with gassy or bloated feelings with regular fiber o Flax Seed - a less gassy fiber than psyllium  No reading or other relaxing activity while on the toilet.  If bowel movements take longer than 5 minutes, you are too constipated  AVOID CONSTIPATION.  High fiber and water intake usually takes care of this.  Sometimes a laxative is needed to stimulate more frequent bowel movements, but   Laxatives are not a good long-term solution as it can wear the colon out. o Osmotics (Milk of  Magnesia, Fleets phosphosoda, Magnesium citrate, MiraLax, GoLytely) are safer than  o Stimulants (Senokot, Castor Oil, Dulcolax, Ex Lax)    o Do not take laxatives for more than 7days in a row.   IF SEVERELY CONSTIPATED, try a Bowel Retraining Program: o Do not use laxatives.  o Eat a diet high in roughage, such as bran cereals and leafy vegetables.  o Drink six (6) ounces of prune or apricot juice each morning.  o Eat two (2) large servings of stewed fruit each day.  o Take one (1) heaping tablespoon of a psyllium-based bulking agent twice a day. Use sugar-free sweetener when possible to avoid excessive calories.  o Eat a normal breakfast.  o Set aside 15 minutes after breakfast to sit on the toilet, but do not strain to have a bowel movement.  o If you do not have a bowel movement by the third day, use an enema and repeat the above steps.   Controlling diarrhea o Switch to liquids and simpler foods for a few days to avoid stressing your intestines further. o Avoid dairy products (especially milk & ice cream) for a short time.  The intestines often can lose the ability to digest lactose when stressed. o Avoid foods that cause gassiness or bloating.  Typical foods include beans and other legumes, cabbage, broccoli, and dairy foods.  Every person has some sensitivity to other foods, so listen to our body and avoid those foods that trigger problems for you. o Adding fiber (Citrucel, Metamucil, psyllium, Miralax) gradually can help thicken stools by absorbing excess fluid and retrain the intestines to act more normally.  Slowly increase the dose over a few weeks.  Too much fiber too soon can backfire and cause cramping & bloating. o Probiotics (such as active yogurt, Align, etc) may help repopulate the intestines and colon with normal bacteria and calm down a sensitive digestive tract.  Most studies show it to be of mild help, though, and such products can be costly. o Medicines: - Bismuth  subsalicylate (ex. Kayopectate, Pepto Bismol) every 30 minutes for up to 6 doses can help control diarrhea.  Avoid if pregnant. - Loperamide (Immodium) can slow down diarrhea.  Start with two tablets (4mg  total) first and then try one tablet every 6 hours.  Avoid if you are having fevers or severe pain.  If you are not better or start feeling worse, stop all medicines and call your doctor for advice o Call your doctor if you are getting worse or not better.  Sometimes further testing (cultures, endoscopy, X-ray studies, bloodwork, etc) may be needed to help diagnose and treat the cause of the diarrhea.

## 2014-10-12 ENCOUNTER — Encounter (HOSPITAL_COMMUNITY): Payer: Self-pay | Admitting: Surgery

## 2014-10-19 ENCOUNTER — Other Ambulatory Visit: Payer: Self-pay | Admitting: *Deleted

## 2014-10-19 DIAGNOSIS — N83202 Unspecified ovarian cyst, left side: Secondary | ICD-10-CM

## 2014-10-26 ENCOUNTER — Other Ambulatory Visit: Payer: Self-pay | Admitting: Women's Health

## 2014-10-26 ENCOUNTER — Other Ambulatory Visit: Payer: Self-pay | Admitting: *Deleted

## 2014-10-26 DIAGNOSIS — N83202 Unspecified ovarian cyst, left side: Secondary | ICD-10-CM

## 2014-11-03 ENCOUNTER — Other Ambulatory Visit: Payer: BC Managed Care – PPO

## 2014-11-03 ENCOUNTER — Ambulatory Visit: Payer: BC Managed Care – PPO | Admitting: Women's Health

## 2014-11-05 ENCOUNTER — Ambulatory Visit (INDEPENDENT_AMBULATORY_CARE_PROVIDER_SITE_OTHER): Payer: BC Managed Care – PPO | Admitting: Women's Health

## 2014-11-05 ENCOUNTER — Encounter: Payer: Self-pay | Admitting: Women's Health

## 2014-11-05 ENCOUNTER — Other Ambulatory Visit: Payer: Self-pay | Admitting: Women's Health

## 2014-11-05 ENCOUNTER — Ambulatory Visit (INDEPENDENT_AMBULATORY_CARE_PROVIDER_SITE_OTHER): Payer: BC Managed Care – PPO

## 2014-11-05 VITALS — BP 118/84 | Ht 60.0 in | Wt 144.0 lb

## 2014-11-05 DIAGNOSIS — N83202 Unspecified ovarian cyst, left side: Secondary | ICD-10-CM

## 2014-11-05 DIAGNOSIS — N83201 Unspecified ovarian cyst, right side: Secondary | ICD-10-CM

## 2014-11-05 DIAGNOSIS — N941 Unspecified dyspareunia: Secondary | ICD-10-CM

## 2014-11-05 DIAGNOSIS — Z803 Family history of malignant neoplasm of breast: Secondary | ICD-10-CM

## 2014-11-05 DIAGNOSIS — N8320 Unspecified ovarian cysts: Secondary | ICD-10-CM

## 2014-11-05 DIAGNOSIS — Z8041 Family history of malignant neoplasm of ovary: Secondary | ICD-10-CM

## 2014-11-05 DIAGNOSIS — D250 Submucous leiomyoma of uterus: Secondary | ICD-10-CM

## 2014-11-05 NOTE — Progress Notes (Signed)
Patient ID: [name], female   DOB: [birth year], 43 y.o.   MRN: [id number] Presents for ultrasound. 10/11/2014 abdominal hernia repair. Having intermittent low pelvic pressure, discomfort. Also struggling with constipation and bloating.  Ultrasound: Single fundal fibroid 23 x 21 x 27 mm. Right ovary appears normal. Left ovarian complex thick-walled avascular 19 x 15 x 16 mm questionable corpus luteal cyst. A large amount of free fluid seen in cul-de-sac and adnexa. Tran's vaginal images.  Probable ruptured Left corpus luteal cyst Pelvic pressure/pain-constipation  Plan: Increase MiraLAX to twice daily, increase fluids, increase walking. Motrin alternate with Tylenol for discomfort.

## 2014-11-05 NOTE — Patient Instructions (Signed)
Ovarian Cyst An ovarian cyst is a fluid-filled sac that forms on an ovary. The ovaries are small organs that produce eggs in women. Various types of cysts can form on the ovaries. Most are not cancerous. Many do not cause problems, and they often go away on their own. Some may cause symptoms and require treatment. Common types of ovarian cysts include:  Functional cysts--These cysts may occur every month during the menstrual cycle. This is normal. The cysts usually go away with the next menstrual cycle if the woman does not get pregnant. Usually, there are no symptoms with a functional cyst.  Endometrioma cysts--These cysts form from the tissue that lines the uterus. They are also called "chocolate cysts" because they become filled with blood that turns brown. This type of cyst can cause pain in the lower abdomen during intercourse and with your menstrual period.  Cystadenoma cysts--This type develops from the cells on the outside of the ovary. These cysts can get very big and cause lower abdomen pain and pain with intercourse. This type of cyst can twist on itself, cut off its blood supply, and cause severe pain. It can also easily rupture and cause a lot of pain.  Dermoid cysts--This type of cyst is sometimes found in both ovaries. These cysts may contain different kinds of body tissue, such as skin, teeth, hair, or cartilage. They usually do not cause symptoms unless they get very big.  Theca lutein cysts--These cysts occur when too much of a certain hormone (human chorionic gonadotropin) is produced and overstimulates the ovaries to produce an egg. This is most common after procedures used to assist with the conception of a baby (in vitro fertilization). CAUSES   Fertility drugs can cause a condition in which multiple large cysts are formed on the ovaries. This is called ovarian hyperstimulation syndrome.  A condition called polycystic ovary syndrome can cause hormonal imbalances that can lead to  nonfunctional ovarian cysts. SIGNS AND SYMPTOMS  Many ovarian cysts do not cause symptoms. If symptoms are present, they may include:  Pelvic pain or pressure.  Pain in the lower abdomen.  Pain during sexual intercourse.  Increasing girth (swelling) of the abdomen.  Abnormal menstrual periods.  Increasing pain with menstrual periods.  Stopping having menstrual periods without being pregnant. DIAGNOSIS  These cysts are commonly found during a routine or annual pelvic exam. Tests may be ordered to find out more about the cyst. These tests may include:  Ultrasound.  X-ray of the pelvis.  CT scan.  MRI.  Blood tests. TREATMENT  Many ovarian cysts go away on their own without treatment. Your health care provider may want to check your cyst regularly for 2-3 months to see if it changes. For women in menopause, it is particularly important to monitor a cyst closely because of the higher rate of ovarian cancer in menopausal women. When treatment is needed, it may include any of the following:  A procedure to drain the cyst (aspiration). This may be done using a long needle and ultrasound. It can also be done through a laparoscopic procedure. This involves using a thin, lighted tube with a tiny camera on the end (laparoscope) inserted through a small incision.  Surgery to remove the whole cyst. This may be done using laparoscopic surgery or an open surgery involving a larger incision in the lower abdomen.  Hormone treatment or birth control pills. These methods are sometimes used to help dissolve a cyst. HOME CARE INSTRUCTIONS   Only take over-the-counter   or prescription medicines as directed by your health care provider.  Follow up with your health care provider as directed.  Get regular pelvic exams and Pap tests. SEEK MEDICAL CARE IF:   Your periods are late, irregular, or painful, or they stop.  Your pelvic pain or abdominal pain does not go away.  Your abdomen becomes  larger or swollen.  You have pressure on your bladder or trouble emptying your bladder completely.  You have pain during sexual intercourse.  You have feelings of fullness, pressure, or discomfort in your stomach.  You lose weight for no apparent reason.  You feel generally ill.  You become constipated.  You lose your appetite.  You develop acne.  You have an increase in body and facial hair.  You are gaining weight, without changing your exercise and eating habits.  You think you are pregnant. SEEK IMMEDIATE MEDICAL CARE IF:   You have increasing abdominal pain.  You feel sick to your stomach (nauseous), and you throw up (vomit).  You develop a fever that comes on suddenly.  You have abdominal pain during a bowel movement.  Your menstrual periods become heavier than usual. MAKE SURE YOU:  Understand these instructions.  Will watch your condition.  Will get help right away if you are not doing well or get worse. Document Released: 08/20/2005 Document Revised: 08/25/2013 Document Reviewed: 04/27/2013 Exit[employer] Patient Information 2015 [employer]. This information is not intended to replace advice given to you by your health care provider. Make sure you discuss any questions you have with your health care provider.  

## 2014-11-07 IMAGING — US US TRANSVAGINAL NON-OB
1 series · 14 of 25 positions shown · non-contrast
Comparison: None

CLINICAL DATA: Left lower quadrant pain.  Evaluate cyst



[Series 1: us transvaginal non-ob · 0.25mm/px · 14 of 58 slices shown]
[im 1/58]
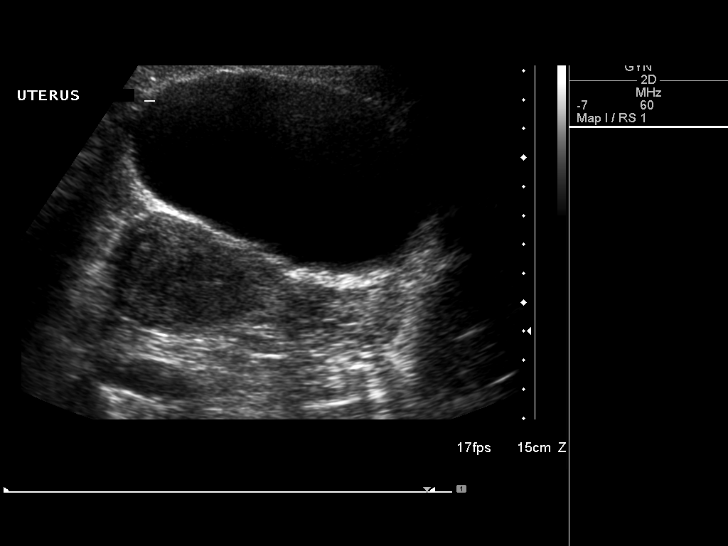
[im 5/58]
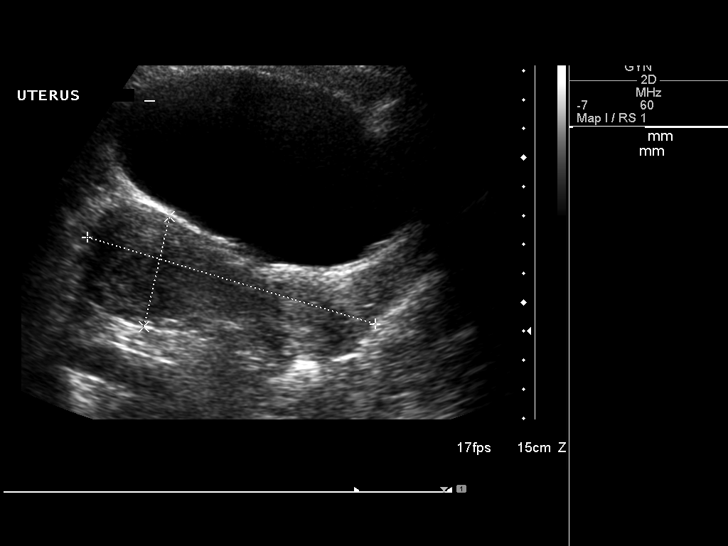
[im 10/58]
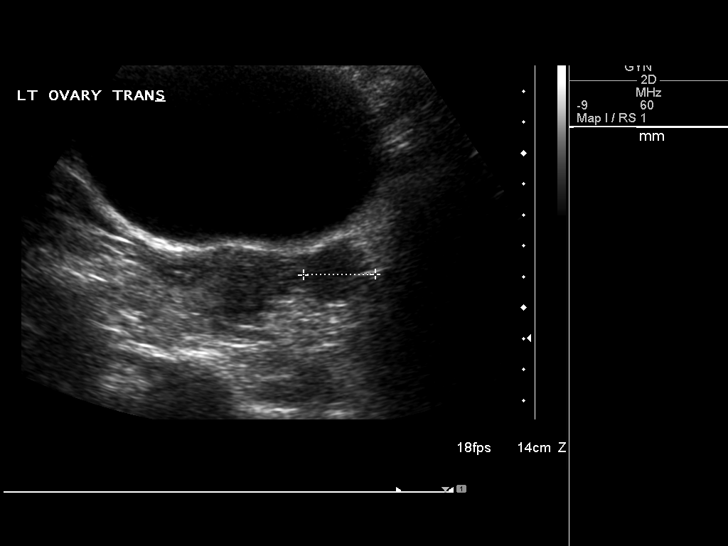
[im 15/58]
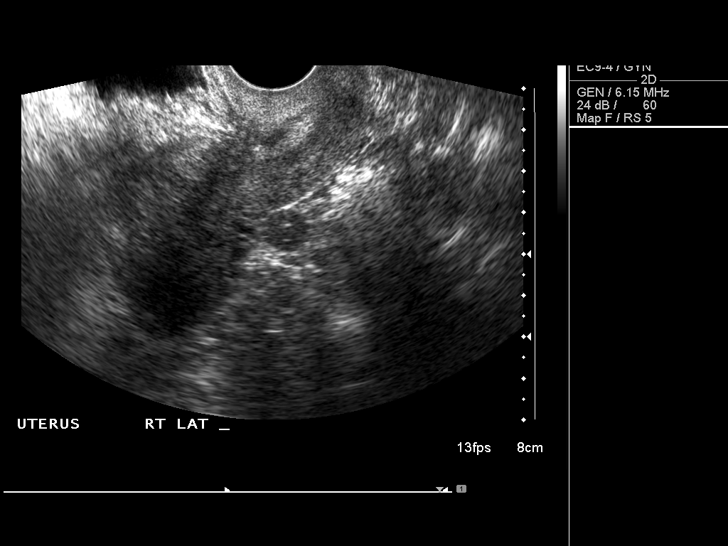
[im 20/58]
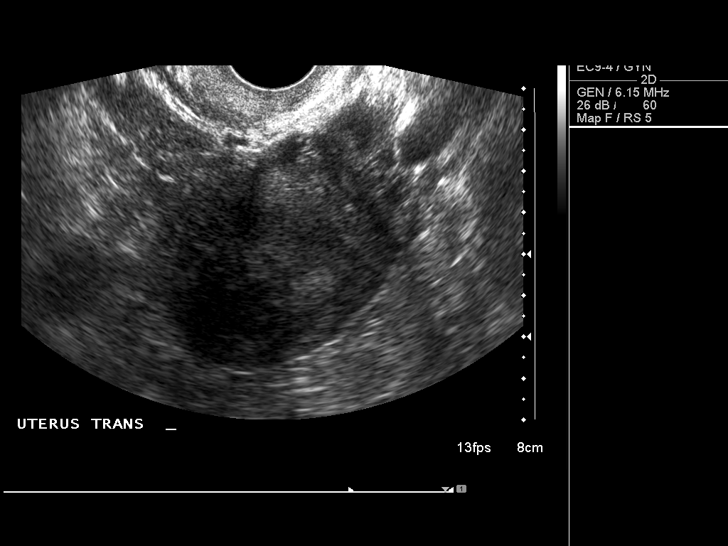
[im 22/58]
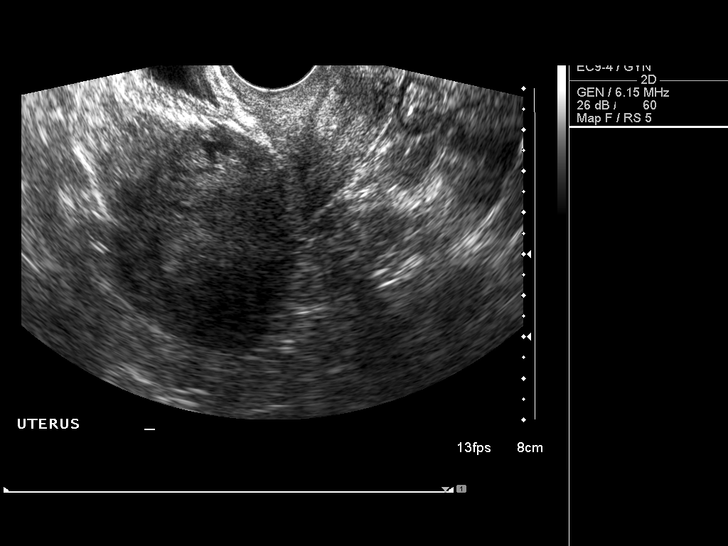
[im 27/58]
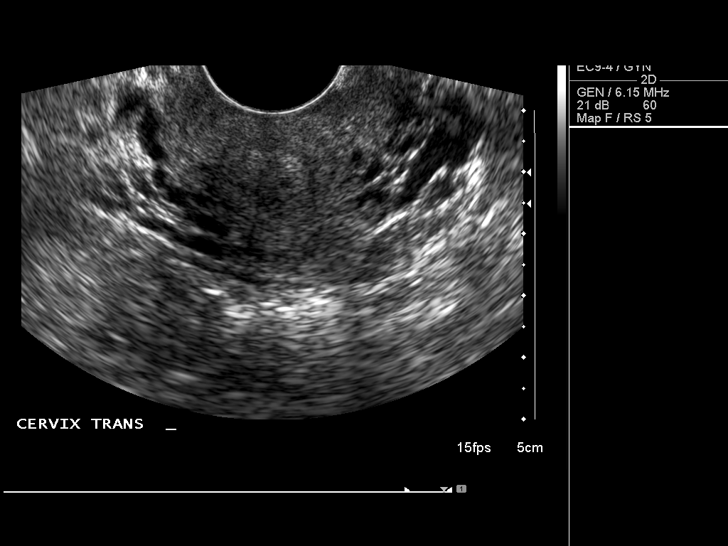
[im 31/58]
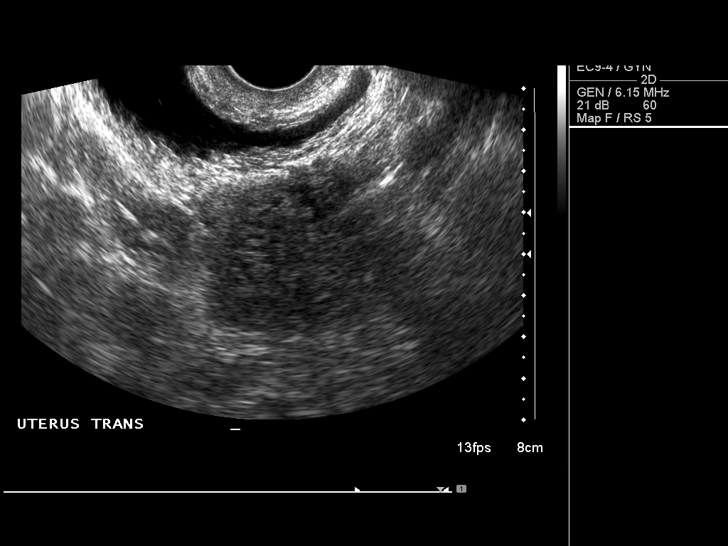
[im 36/58]
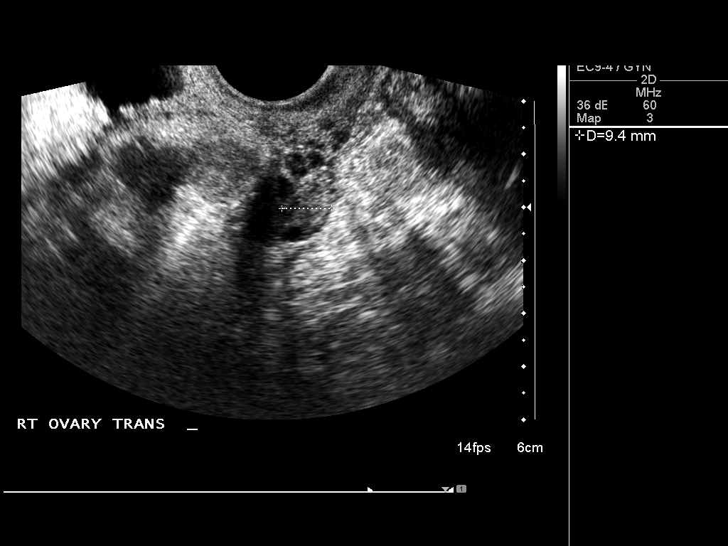
[im 39/58]
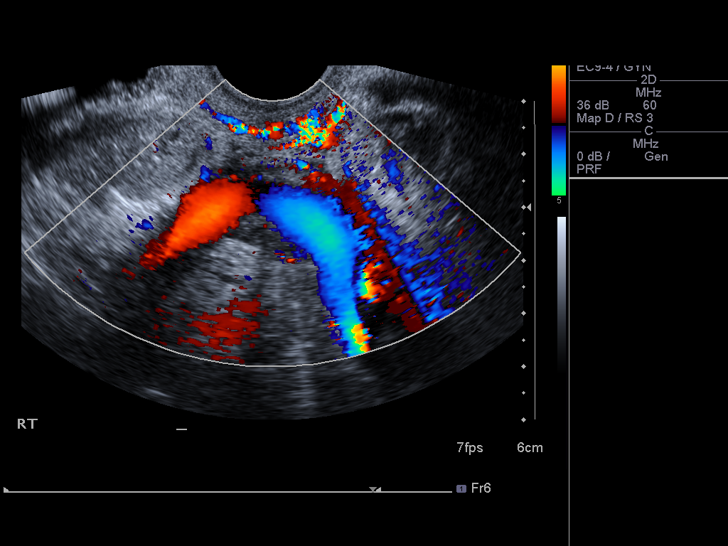
[im 43/58]
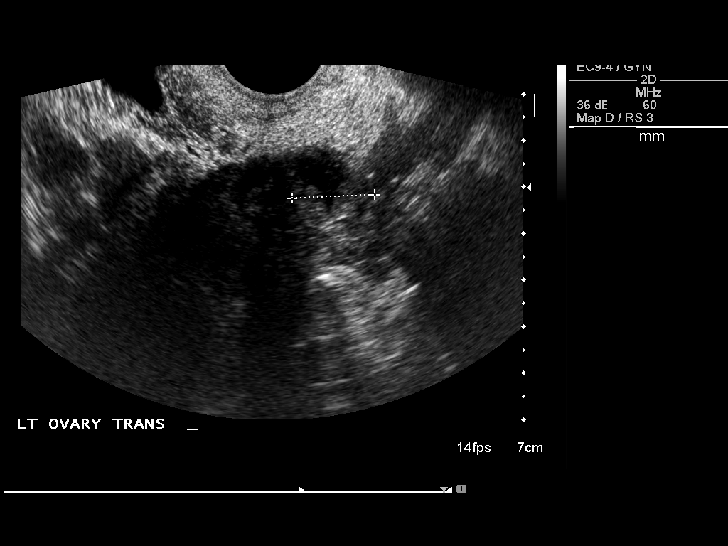
[im 48/58]
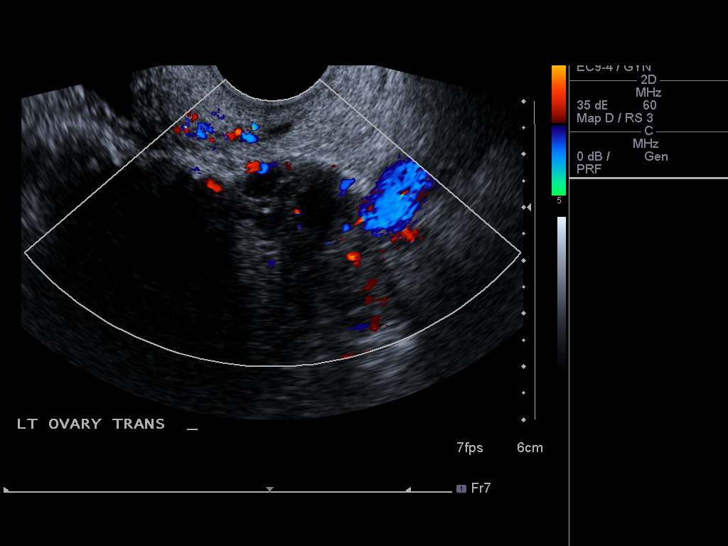
[im 53/58]
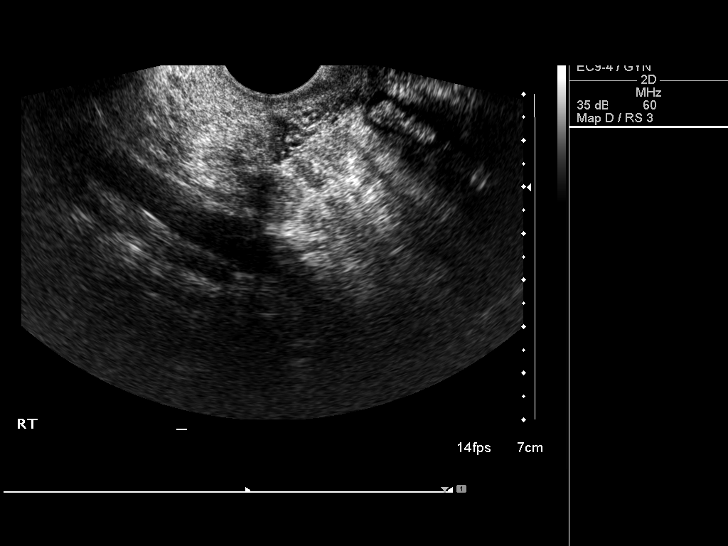
[im 58/58]
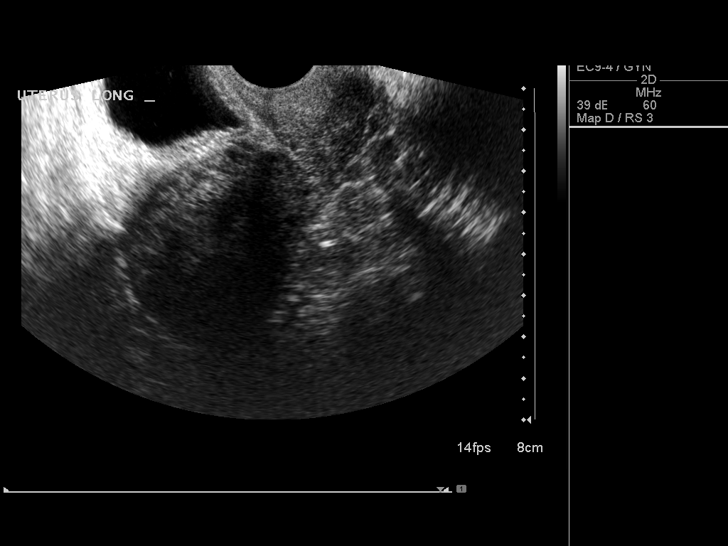

[14 of 25 positions shown; findings below may reference images not displayed]

FINDINGS: Uterus

Measurements: 10.4 x 3.9 x 5.9 cm. Subserosal fibroid arising from
the posterior uterine fundus measures 2.3 x 1.8 x 2.9 cm.

Endometrium

Thickness: 11.1 mm.  No focal abnormality visualized.

Right ovary

Measurements: 2 x 0.9 x 0.9 cm. Normal appearance/no adnexal mass.

Left ovary

Measurements: 2.9 x 1.7 x 1.8 cm. Corpus luteal cyst is identified
within the left ovary measuring 1.8 x 1.7 x 1.7 cm..

Other findings

No free fluid.
IMPRESSION: 1. Uterine fibroid.
2. Corpus luteal cyst noted in the left ovary.

## 2015-07-21 ENCOUNTER — Encounter: Payer: Self-pay | Admitting: Women's Health

## 2015-07-21 ENCOUNTER — Ambulatory Visit (INDEPENDENT_AMBULATORY_CARE_PROVIDER_SITE_OTHER): Payer: BC Managed Care – PPO | Admitting: Women's Health

## 2015-07-21 DIAGNOSIS — N920 Excessive and frequent menstruation with regular cycle: Secondary | ICD-10-CM

## 2015-07-21 MED ORDER — MEDROXYPROGESTERONE ACETATE 150 MG/ML IM SUSP
150.0000 mg | Freq: Once | INTRAMUSCULAR | Status: DC
Start: 2015-07-21 — End: 2016-05-22

## 2015-07-21 NOTE — Patient Instructions (Signed)
Sonohysterogram A sonohysterogram is a procedure to examine the inside of your uterus. This exam uses sound waves sent to a computer to make real-time pictures of the inside of your uterus. To get the best images, a germ-free, saltwater solution (sterile saline) is injected into your uterus through your vagina. A sonohysterogram can show whether there is scarring or abnormal growths inside your uterus. It can also show whether your uterus is an abnormal shape or whether the lining is too thin.  LET [facility] CARE PROVIDER KNOW ABOUT:  Any allergies you have.  All medicines you are taking, including vitamins, herbs, eyedrops, creams, and over-the-counter medicines.  Previous problems you or members of your family have had with the use of anesthetics.  Any blood disorders you have.  Previous surgeries you have had.  Medical conditions you have.  The dates of your last period.  Possibility of pregnancy. RISKS AND COMPLICATIONS Generally, a sonohysterogram is a safe procedure. However, as with any procedure, problems can occur. Possible problems include:  Bleeding.  Infection. BEFORE THE PROCEDURE  Your health care provider may have you take an over-the-counter pain medicine.  You may get a prescription for antibiotic medicine.  Your health care provider may give you a pregnancy test before the procedure.  You will empty your bladder. PROCEDURE   You will lie down on the examining table with your knees raised or your feet in stirrups.  Your health care provider may do a pelvic exam before starting the procedure.  A slender, handheld device (transducer) will be lubricated and placed into your vagina.  The transducer will be positioned to send sound waves to your uterus.  The sound waves will bounce back to the transducer. They will be sent to a computer.  The computer will turn the sound waves into live images.  Your health care provider will view the images on a screen  during the procedure.  Your health care provider will remove the transducer from your vagina and use an instrument to widen the opening (speculum).  A swab will be used to clean the opening to your uterus (cervix).  A long, thin tube (catheter) will then be placed through your cervix into your uterus.  Your health care provider will fill your uterus with sterile saline through the catheter. You may feel some cramping.  The speculum will be removed.  The transducer will be placed back in your vagina to take more images. AFTER THE PROCEDURE After the procedure, it is typical to have light bleeding from your vagina, cramping, and watery vaginal discharge.    This information is not intended to replace advice given to you by your health care provider. Make sure you discuss any questions you have with your health care provider.   Document Released: 01/04/2014 Document Reviewed: 01/04/2014 Elsevier Interactive Patient Education 2016 [place]. Endometrial Ablation Endometrial ablation removes the lining of the uterus (endometrium). It is usually a same-day, outpatient treatment. Ablation helps avoid major surgery, such as surgery to remove the cervix and uterus (hysterectomy). After endometrial ablation, you will have little or no menstrual bleeding and may not be able to have children. However, if you are premenopausal, you will need to use a reliable method of birth control following the procedure because of the small chance that pregnancy can occur. There are different reasons to have this procedure. These reasons include:  Heavy periods.  Bleeding that is causing anemia.  Irregular bleeding.  Bleeding fibroids on the lining inside the uterus  if they are smaller than 3 centimeters. This procedure may not be possible for you if:   You want to have children in the future.   You have severe cramps with your menstrual period.   You have precancerous or cancerous cells in your  uterus.   You were recently pregnant.   You have gone through menopause.   You have had major surgery on your uterus, resulting in thinning of the uterine wall. Surgeries may include:  The removal of one or more uterine fibroids (myomectomy).  A cesarean section with a classic (vertical) incision on your uterus. Ask your health care provider what type of cesarean you had. Sometimes the scar on your skin is different than the scar on your uterus. Even if you have had surgery on your uterus, certain types of ablation may still be safe for you. Talk with your health care provider. LET [facility] CARE PROVIDER KNOW ABOUT:  Any allergies you have.  All medicines you are taking, including vitamins, herbs, eye drops, creams, and over-the-counter medicines.  Previous problems you or members of your family have had with the use of anesthetics.  Any blood disorders you have.  Previous surgeries you have had.  Medical conditions you have. RISKS AND COMPLICATIONS  Generally, this is a safe procedure. However, as with any procedure, complications can occur. Possible complications include:  Perforation of the uterus.  Bleeding.  Infection of the uterus, bladder, or vagina.  Injury to surrounding organs.  An air bubble to the lung (air embolus).  Pregnancy following the procedure.  Failure of the procedure to help the problem, requiring hysterectomy.  Decreased ability to diagnose cancer in the lining of the uterus. BEFORE THE PROCEDURE  The lining of the uterus must be tested to make sure there is no pre-cancerous or cancer cells present.  An ultrasound may be performed to look at the size of the uterus and to check for abnormalities.  Medicines may be given to thin the lining of the uterus. PROCEDURE  During the procedure, your health care provider will use a tool called a resectoscope to help see inside your uterus. There are different ways to remove the lining of your  uterus.   Radiofrequency - This method uses a radiofrequency-alternating electric current to remove the lining of the uterus.  Cryotherapy - This method uses extreme cold to freeze the lining of the uterus.  Heated-Free Liquid - This method uses heated salt (saline) solution to remove the lining of the uterus.  Microwave - This method uses high-energy microwaves to heat up the lining of the uterus to remove it.  Thermal balloon - This method involves inserting a catheter with a balloon tip into the uterus. The balloon tip is filled with heated fluid to remove the lining of the uterus. AFTER THE PROCEDURE  After your procedure, do not have sexual intercourse or insert anything into your vagina until permitted by your health care provider. After the procedure, you may experience:  Cramps.  Vaginal discharge.  Frequent urination.   This information is not intended to replace advice given to you by your health care provider. Make sure you discuss any questions you have with your health care provider.   Document Released: 06/29/2004 Document Revised: 05/11/2015 Document Reviewed: 01/21/2013 Elsevier Interactive Patient Education [employer].

## 2015-07-21 NOTE — Progress Notes (Signed)
Patient ID: [name], female   DOB: 1971/05/12, 44 y.o.   MRN: [id number] Presents with complaint of heavy menstrual cycles and fatigue. Cycles are every month lasting 5 days with dysmenorrhea and heavy flow. Using 2 pads at a time, changing every 1-2 hours on heavy flow days, and sometimes bleeds through clothing. Reports has always had heavy cycles but they have become progressively heavier. Partner vasectomy. Had been on Depo-Provera in the past with good relief of menorrhagia. History of a 2 1/2 centimeter fundal fibroid. No bleeding between cycles. Was seen at primary care last week had a normal CBC. Denies vaginal discharge, urinary symptoms, fever.  Exam: Appears well. External genitalia within normal limits, speculum exam cervix healthy without visible lesion or discharge. Bimanual uterus slightly enlarged nontender no adnexal fullness or tenderness.  Menorrhagia with dysmenorrhea  Plan: Options reviewed endometrial ablation, Mirena IUD, Depo Provera, OCs. Will try Depo-Provera 150 IM every 12 weeks prescription, proper use given instructed to pick up prescription, call first day of next cycle and we'll see how cycles are on Depo-Provera with no relief will then proceed to possible endometrial ablation.

## 2015-08-10 ENCOUNTER — Encounter: Payer: BC Managed Care – PPO | Admitting: Women's Health

## 2015-09-05 ENCOUNTER — Ambulatory Visit: Payer: BC Managed Care – PPO | Admitting: Women's Health

## 2016-05-22 ENCOUNTER — Ambulatory Visit (INDEPENDENT_AMBULATORY_CARE_PROVIDER_SITE_OTHER): Payer: BC Managed Care – PPO | Admitting: Women's Health

## 2016-05-22 ENCOUNTER — Encounter: Payer: Self-pay | Admitting: Women's Health

## 2016-05-22 VITALS — BP 119/79 | Ht 61.0 in | Wt 152.8 lb

## 2016-05-22 DIAGNOSIS — Z01419 Encounter for gynecological examination (general) (routine) without abnormal findings: Secondary | ICD-10-CM | POA: Diagnosis not present

## 2016-05-22 DIAGNOSIS — Z13220 Encounter for screening for lipoid disorders: Secondary | ICD-10-CM | POA: Diagnosis not present

## 2016-05-22 DIAGNOSIS — Z23 Encounter for immunization: Secondary | ICD-10-CM

## 2016-05-22 DIAGNOSIS — N920 Excessive and frequent menstruation with regular cycle: Secondary | ICD-10-CM

## 2016-05-22 LAB — CBC WITH DIFFERENTIAL/PLATELET
BASOS ABS: 0 {cells}/uL (ref 0–200)
Basophils Relative: 0 %
EOS ABS: 182 {cells}/uL (ref 15–500)
Eosinophils Relative: 2 %
HEMATOCRIT: 35.9 % (ref 35.0–45.0)
HEMOGLOBIN: 11.4 g/dL — AB (ref 11.7–15.5)
LYMPHS ABS: 2730 {cells}/uL (ref 850–3900)
Lymphocytes Relative: 30 %
MCH: 26.5 pg — AB (ref 27.0–33.0)
MCHC: 31.8 g/dL — ABNORMAL LOW (ref 32.0–36.0)
MCV: 83.5 fL (ref 80.0–100.0)
MPV: 10.9 fL (ref 7.5–12.5)
Monocytes Absolute: 455 cells/uL (ref 200–950)
Monocytes Relative: 5 %
NEUTROS ABS: 5733 {cells}/uL (ref 1500–7800)
NEUTROS PCT: 63 %
Platelets: 267 10*3/uL (ref 140–400)
RBC: 4.3 MIL/uL (ref 3.80–5.10)
RDW: 15.1 % — ABNORMAL HIGH (ref 11.0–15.0)
WBC: 9.1 10*3/uL (ref 3.8–10.8)

## 2016-05-22 LAB — COMPREHENSIVE METABOLIC PANEL
ALBUMIN: 4.4 g/dL (ref 3.6–5.1)
ALT: 13 U/L (ref 6–29)
AST: 16 U/L (ref 10–35)
Alkaline Phosphatase: 48 U/L (ref 33–115)
BUN: 10 mg/dL (ref 7–25)
CALCIUM: 9.4 mg/dL (ref 8.6–10.2)
CHLORIDE: 104 mmol/L (ref 98–110)
CO2: 25 mmol/L (ref 20–31)
CREATININE: 0.75 mg/dL (ref 0.50–1.10)
Glucose, Bld: 111 mg/dL — ABNORMAL HIGH (ref 65–99)
POTASSIUM: 3.9 mmol/L (ref 3.5–5.3)
Sodium: 137 mmol/L (ref 135–146)
TOTAL PROTEIN: 7.4 g/dL (ref 6.1–8.1)
Total Bilirubin: 0.2 mg/dL (ref 0.2–1.2)

## 2016-05-22 LAB — TSH: TSH: 1.62 mIU/L

## 2016-05-22 LAB — LIPID PANEL
CHOL/HDL RATIO: 5.9 ratio — AB (ref <=5.0)
CHOLESTEROL: 182 mg/dL (ref 125–200)
HDL: 31 mg/dL — AB (ref >=46)
LDL Cholesterol: 107 mg/dL (ref <130)
TRIGLYCERIDES: 219 mg/dL — AB (ref <150)
VLDL: 44 mg/dL — AB (ref <30)

## 2016-05-22 MED ORDER — MEDROXYPROGESTERONE ACETATE 150 MG/ML IM SUSP: 150.0000 mg | Freq: Once | INTRAMUSCULAR | 4 refills | Status: DC

## 2016-05-22 NOTE — Progress Notes (Signed)
[name] [birth year] [id number]    History:    Presents for annual exam.  Monthly heavy cycles had been on Depo-Provera in the past. Partner vasectomy/same. 10/2014 hernia repair. Normal Pap and mammogram history overdue for mammogram.  Past medical history, past surgical history, family history and social history were all reviewed and documented in the EPIC chart. Kindergarten Pharmacist, hospital. Parents hypertension, mother COPD deceased. Son 75 senior in high school not sure of college plans. Daughter 69 has a 66-year-old and a 79-month-old and has moved back home.  ROS:  A ROS was performed and pertinent positives and negatives are included.  Exam:  Vitals:   05/22/16 1527  BP: 119/79  Weight: 152 lb 12.8 oz (69.3 kg)  Height: 5\' 1"  (1.549 m)   Body mass index is 28.87 kg/m.   General appearance:  Normal Thyroid:  Symmetrical, normal in size, without palpable masses or nodularity. Respiratory  Auscultation:  Clear without wheezing or rhonchi Cardiovascular  Auscultation:  Regular rate, without rubs, murmurs or gallops  Edema/varicosities:  Not grossly evident Abdominal  Soft,nontender, without masses, guarding or rebound.  Liver/spleen:  No organomegaly noted  Hernia:  None appreciated  Skin  Inspection:  Grossly normal   Breasts: Examined lying and sitting. Pendulous    Right: Without masses, retractions, discharge or axillary adenopathy.     Left: Without masses, retractions, discharge or axillary adenopathy. Gentitourinary   Inguinal/mons:  Normal without inguinal adenopathy  External genitalia:  Normal  BUS/Urethra/Skene's glands:  Normal  Vagina:  Normal  Cervix:  Normal  Uterus:   normal in size, shape and contour.  Midline and mobile  Adnexa/parametria:     Rt: Without masses or tenderness.   Lt: Without masses or tenderness.  Anus and perineum: Normal  Digital rectal exam: Normal sphincter tone without palpated masses or tenderness  Assessment/Plan:  45 y.o. SBF  G3P2  for annual exam.     Monthly heavy cycles/partner vasectomy Situational stress daughter with 2 small children living with her Fatigue  Plan: Depo-Provera 150 IM every 3 months, instructed to bring Depo-Provera to office with next cycle. Reviewed may take several months to get amenorrheic, instructed to call if continued problems with heavy cycles. Encouraged counseling, reviewed importance of setting limits with daughter. 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 Encouraged leisure activities. SBE's, overdue for mammogram instructed to schedule. Reviewed importance of annual screen. Exercise, calcium rich diet, vitamin D 1000 daily encouraged. CBC, CMP, lipid panel, UA, Pap with HR HPV typing, new screening guidelines reviewed.    [place], 5:02 PM 05/22/2016

## 2016-05-22 NOTE — Patient Instructions (Signed)
Health Maintenance, Female Adopting a healthy lifestyle and getting preventive care can go a long way to promote health and wellness. Talk with your health care provider about what schedule of regular examinations is right for you. This is a good chance for you to check in with your provider about disease prevention and staying healthy. In between checkups, there are plenty of things you can do on your own. Experts have done a lot of research about which lifestyle changes and preventive measures are most likely to keep you healthy. Ask your health care provider for more information. WEIGHT AND DIET  Eat a healthy diet  Be sure to include plenty of vegetables, fruits, low-fat dairy products, and lean protein.  Do not eat a lot of foods high in solid fats, added sugars, or salt.  Get regular exercise. This is one of the most important things you can do for your health.  Most adults should exercise for at least 150 minutes each week. The exercise should increase your heart rate and make you sweat (moderate-intensity exercise).  Most adults should also do strengthening exercises at least twice a week. This is in addition to the moderate-intensity exercise.  Maintain a healthy weight  Body mass index (BMI) is a measurement that can be used to identify possible weight problems. It estimates body fat based on height and weight. Your health care provider can help determine your BMI and help you achieve or maintain a healthy weight.  For females 20 years of age and older:   A BMI below 18.5 is considered underweight.  A BMI of 18.5 to 24.9 is normal.  A BMI of 25 to 29.9 is considered overweight.  A BMI of 30 and above is considered obese.  Watch levels of cholesterol and blood lipids  You should start having your blood tested for lipids and cholesterol at 47 years of age, then have this test every 5 years.  You may need to have your cholesterol levels checked more often if:  Your lipid  or cholesterol levels are high.  You are older than 47 years of age.  You are at high risk for heart disease.  CANCER SCREENING   Lung Cancer  Lung cancer screening is recommended for adults 55-80 years old who are at high risk for lung cancer because of a history of smoking.  A yearly low-dose CT scan of the lungs is recommended for people who:  Currently smoke.  Have quit within the past 15 years.  Have at least a 30-pack-year history of smoking. A pack year is smoking an average of one pack of cigarettes a day for 1 year.  Yearly screening should continue until it has been 15 years since you quit.  Yearly screening should stop if you develop a health problem that would prevent you from having lung cancer treatment.  Breast Cancer  Practice breast self-awareness. This means understanding how your breasts normally appear and feel.  It also means doing regular breast self-exams. Let your health care provider know about any changes, no matter how small.  If you are in your 20s or 30s, you should have a clinical breast exam (CBE) by a health care provider every 1-3 years as part of a regular health exam.  If you are 40 or older, have a CBE every year. Also consider having a breast X-ray (mammogram) every year.  If you have a family history of breast cancer, talk to your health care provider about genetic screening.  If you   are at high risk for breast cancer, talk to your health care provider about having an MRI and a mammogram every year.  Breast cancer gene (BRCA) assessment is recommended for women who have family members with BRCA-related cancers. BRCA-related cancers include:  Breast.  Ovarian.  Tubal.  Peritoneal cancers.  Results of the assessment will determine the need for genetic counseling and BRCA1 and BRCA2 testing. Cervical Cancer Your health care provider may recommend that you be screened regularly for cancer of the pelvic organs (ovaries, uterus, and  vagina). This screening involves a pelvic examination, including checking for microscopic changes to the surface of your cervix (Pap test). You may be encouraged to have this screening done every 3 years, beginning at age 21.  For women ages 30-65, health care providers may recommend pelvic exams and Pap testing every 3 years, or they may recommend the Pap and pelvic exam, combined with testing for human papilloma virus (HPV), every 5 years. Some types of HPV increase your risk of cervical cancer. Testing for HPV may also be done on women of any age with unclear Pap test results.  Other health care providers may not recommend any screening for nonpregnant women who are considered low risk for pelvic cancer and who do not have symptoms. Ask your health care provider if a screening pelvic exam is right for you.  If you have had past treatment for cervical cancer or a condition that could lead to cancer, you need Pap tests and screening for cancer for at least 20 years after your treatment. If Pap tests have been discontinued, your risk factors (such as having a new sexual partner) need to be reassessed to determine if screening should resume. Some women have medical problems that increase the chance of getting cervical cancer. In these cases, your health care provider may recommend more frequent screening and Pap tests. Colorectal Cancer  This type of cancer can be detected and often prevented.  Routine colorectal cancer screening usually begins at 47 years of age and continues through 47 years of age.  Your health care provider may recommend screening at an earlier age if you have risk factors for colon cancer.  Your health care provider may also recommend using home test kits to check for hidden blood in the stool.  A small camera at the end of a tube can be used to examine your colon directly (sigmoidoscopy or colonoscopy). This is done to check for the earliest forms of colorectal  cancer.  Routine screening usually begins at age 50.  Direct examination of the colon should be repeated every 5-10 years through 47 years of age. However, you may need to be screened more often if early forms of precancerous polyps or small growths are found. Skin Cancer  Check your skin from head to toe regularly.  Tell your health care provider about any new moles or changes in moles, especially if there is a change in a mole's shape or color.  Also tell your health care provider if you have a mole that is larger than the size of a pencil eraser.  Always use sunscreen. Apply sunscreen liberally and repeatedly throughout the day.  Protect yourself by wearing long sleeves, pants, a wide-brimmed hat, and sunglasses whenever you are outside. HEART DISEASE, DIABETES, AND HIGH BLOOD PRESSURE   High blood pressure causes heart disease and increases the risk of stroke. High blood pressure is more likely to develop in:  People who have blood pressure in the high end   of the normal range (130-139/85-89 mm Hg).  People who are overweight or obese.  People who are African American.  If you are 38-23 years of age, have your blood pressure checked every 3-5 years. If you are 61 years of age or older, have your blood pressure checked every year. You should have your blood pressure measured twice--once when you are at a hospital or clinic, and once when you are not at a hospital or clinic. Record the average of the two measurements. To check your blood pressure when you are not at a hospital or clinic, you can use:  An automated blood pressure machine at a pharmacy.  A home blood pressure monitor.  If you are between 45 years and 39 years old, ask your health care provider if you should take aspirin to prevent strokes.  Have regular diabetes screenings. This involves taking a blood sample to check your fasting blood sugar level.  If you are at a normal weight and have a low risk for diabetes,  have this test once every three years after 47 years of age.  If you are overweight and have a high risk for diabetes, consider being tested at a younger age or more often. PREVENTING INFECTION  Hepatitis B  If you have a higher risk for hepatitis B, you should be screened for this virus. You are considered at high risk for hepatitis B if:  You were born in a country where hepatitis B is common. Ask your health care provider which countries are considered high risk.  Your parents were born in a high-risk country, and you have not been immunized against hepatitis B (hepatitis B vaccine).  You have HIV or AIDS.  You use needles to inject street drugs.  You live with someone who has hepatitis B.  You have had sex with someone who has hepatitis B.  You get hemodialysis treatment.  You take certain medicines for conditions, including cancer, organ transplantation, and autoimmune conditions. Hepatitis C  Blood testing is recommended for:  Everyone born from [birth year] through 1967.  Anyone with known risk factors for hepatitis C. Sexually transmitted infections (STIs)  You should be screened for sexually transmitted infections (STIs) including gonorrhea and chlamydia if:  You are sexually active and are younger than 47 years of age.  You are older than 47 years of age and your health care provider tells you that you are at risk for this type of infection.  Your sexual activity has changed since you were last screened and you are at an increased risk for chlamydia or gonorrhea. Ask your health care provider if you are at risk.  If you do not have HIV, but are at risk, it may be recommended that you take a prescription medicine daily to prevent HIV infection. This is called pre-exposure prophylaxis (PrEP). You are considered at risk if:  You are sexually active and do not regularly use condoms or know the HIV status of your partner(s).  You take drugs by injection.  You are sexually  active with a partner who has HIV. Talk with your health care provider about whether you are at high risk of being infected with HIV. If you choose to begin PrEP, you should first be tested for HIV. You should then be tested every 3 months for as long as you are taking PrEP.  PREGNANCY   If you are premenopausal and you may become pregnant, ask your health care provider about preconception counseling.  If you may  become pregnant, take 400 to 800 micrograms (mcg) of folic acid every day.  If you want to prevent pregnancy, talk to your health care provider about birth control (contraception). OSTEOPOROSIS AND MENOPAUSE   Osteoporosis is a disease in which the bones lose minerals and strength with aging. This can result in serious bone fractures. Your risk for osteoporosis can be identified using a bone density scan.  If you are 61 years of age or older, or if you are at risk for osteoporosis and fractures, ask your health care provider if you should be screened.  Ask your health care provider whether you should take a calcium or vitamin D supplement to lower your risk for osteoporosis.  Menopause may have certain physical symptoms and risks.  Hormone replacement therapy may reduce some of these symptoms and risks. Talk to your health care provider about whether hormone replacement therapy is right for you.  HOME CARE INSTRUCTIONS   Schedule regular health, dental, and eye exams.  Stay current with your immunizations.   Do not use any tobacco products including cigarettes, chewing tobacco, or electronic cigarettes.  If you are pregnant, do not drink alcohol.  If you are breastfeeding, limit how much and how often you drink alcohol.  Limit alcohol intake to no more than 1 drink per day for nonpregnant women. One drink equals 12 ounces of beer, 5 ounces of wine, or 1 ounces of hard liquor.  Do not use street drugs.  Do not share needles.  Ask your health care provider for help if  you need support or information about quitting drugs.  Tell your health care provider if you often feel depressed.  Tell your health care provider if you have ever been abused or do not feel safe at home.   This information is not intended to replace advice given to you by your health care provider. Make sure you discuss any questions you have with your health care provider.   Document Released: 03/05/2011 Document Revised: 09/10/2014 Document Reviewed: 07/22/2013 Elsevier Interactive Patient Education [employer].

## 2016-05-23 DIAGNOSIS — Z23 Encounter for immunization: Secondary | ICD-10-CM | POA: Diagnosis not present

## 2016-05-24 ENCOUNTER — Other Ambulatory Visit: Payer: Self-pay | Admitting: Women's Health

## 2016-05-24 DIAGNOSIS — E781 Pure hyperglyceridemia: Secondary | ICD-10-CM

## 2016-05-24 DIAGNOSIS — R7309 Other abnormal glucose: Secondary | ICD-10-CM

## 2016-05-24 LAB — PAP, TP IMAGING W/ HPV RNA, RFLX HPV TYPE 16,18/45: HPV mRNA, High Risk: NOT DETECTED

## 2016-05-29 ENCOUNTER — Other Ambulatory Visit: Payer: Self-pay | Admitting: Women's Health

## 2016-05-29 DIAGNOSIS — Z1231 Encounter for screening mammogram for malignant neoplasm of breast: Secondary | ICD-10-CM

## 2016-06-01 ENCOUNTER — Ambulatory Visit
Admission: RE | Admit: 2016-06-01 | Discharge: 2016-06-01 | Disposition: A | Discharge status: Home / Self Care | Payer: BC Managed Care – PPO | Source: Ambulatory Visit | Attending: Women's Health | Admitting: Women's Health

## 2016-06-01 DIAGNOSIS — Z1231 Encounter for screening mammogram for malignant neoplasm of breast: Secondary | ICD-10-CM

## 2016-09-11 ENCOUNTER — Other Ambulatory Visit: Payer: BC Managed Care – PPO

## 2016-09-11 DIAGNOSIS — E781 Pure hyperglyceridemia: Secondary | ICD-10-CM

## 2016-09-11 DIAGNOSIS — R7309 Other abnormal glucose: Secondary | ICD-10-CM

## 2016-09-11 LAB — HEMOGLOBIN A1C
Hgb A1c MFr Bld: 5.9 % — ABNORMAL HIGH (ref <5.7)
Mean Plasma Glucose: 123 mg/dL

## 2016-09-11 LAB — LIPID PANEL
CHOL/HDL RATIO: 5.4 ratio — AB (ref <5.0)
Cholesterol: 201 mg/dL — ABNORMAL HIGH (ref <200)
HDL: 37 mg/dL — AB (ref >50)
LDL CALC: 130 mg/dL — AB (ref <100)
TRIGLYCERIDES: 171 mg/dL — AB (ref <150)
VLDL: 34 mg/dL — AB (ref <30)

## 2016-09-12 ENCOUNTER — Other Ambulatory Visit: Payer: Self-pay | Admitting: Women's Health

## 2016-09-12 DIAGNOSIS — Z13220 Encounter for screening for lipoid disorders: Secondary | ICD-10-CM

## 2016-09-12 DIAGNOSIS — R739 Hyperglycemia, unspecified: Secondary | ICD-10-CM

## 2016-12-04 ENCOUNTER — Other Ambulatory Visit: Payer: BC Managed Care – PPO

## 2016-12-05 ENCOUNTER — Other Ambulatory Visit: Payer: BC Managed Care – PPO

## 2016-12-05 DIAGNOSIS — Z13220 Encounter for screening for lipoid disorders: Secondary | ICD-10-CM

## 2016-12-05 DIAGNOSIS — R739 Hyperglycemia, unspecified: Secondary | ICD-10-CM

## 2016-12-05 LAB — LIPID PANEL
CHOLESTEROL: 191 mg/dL (ref <200)
HDL: 38 mg/dL — AB (ref >50)
LDL Cholesterol: 129 mg/dL — ABNORMAL HIGH (ref <100)
Total CHOL/HDL Ratio: 5 Ratio — ABNORMAL HIGH (ref <5.0)
Triglycerides: 121 mg/dL (ref <150)
VLDL: 24 mg/dL (ref <30)

## 2016-12-06 LAB — HEMOGLOBIN A1C
HEMOGLOBIN A1C: 5.8 % — AB (ref <5.7)
Mean Plasma Glucose: 120 mg/dL

## 2017-01-16 ENCOUNTER — Encounter: Payer: Self-pay | Admitting: Gynecology

## 2017-05-28 ENCOUNTER — Encounter: Payer: BC Managed Care – PPO | Admitting: Women's Health

## 2017-07-09 ENCOUNTER — Other Ambulatory Visit: Payer: Self-pay | Admitting: Women's Health

## 2017-07-09 DIAGNOSIS — Z1231 Encounter for screening mammogram for malignant neoplasm of breast: Secondary | ICD-10-CM

## 2017-08-06 ENCOUNTER — Ambulatory Visit: Payer: BC Managed Care – PPO

## 2017-08-06 ENCOUNTER — Ambulatory Visit
Admission: RE | Admit: 2017-08-06 | Discharge: 2017-08-06 | Disposition: A | Discharge status: Home / Self Care | Payer: BC Managed Care – PPO | Source: Ambulatory Visit | Attending: Women's Health | Admitting: Women's Health

## 2017-08-06 DIAGNOSIS — Z1231 Encounter for screening mammogram for malignant neoplasm of breast: Secondary | ICD-10-CM

## 2017-09-06 ENCOUNTER — Encounter: Payer: Self-pay | Admitting: Women's Health

## 2017-09-06 ENCOUNTER — Ambulatory Visit: Payer: BC Managed Care – PPO | Admitting: Women's Health

## 2017-09-06 VITALS — BP 110/80 | Wt 140.0 lb

## 2017-09-06 DIAGNOSIS — Z01419 Encounter for gynecological examination (general) (routine) without abnormal findings: Secondary | ICD-10-CM

## 2017-09-06 DIAGNOSIS — Z13220 Encounter for screening for lipoid disorders: Secondary | ICD-10-CM

## 2017-09-06 DIAGNOSIS — Z833 Family history of diabetes mellitus: Secondary | ICD-10-CM | POA: Diagnosis not present

## 2017-09-06 NOTE — Patient Instructions (Signed)
Fat and Cholesterol Restricted Diet Getting too much fat and cholesterol in your diet may cause health problems. Following this diet helps keep your fat and cholesterol at normal levels. This can keep you from getting sick. What types of fat should I choose?  Choose monosaturated and polyunsaturated fats. These are found in foods such as olive oil, canola oil, flaxseeds, walnuts, almonds, and seeds.  Eat more omega-3 fats. Good choices include salmon, mackerel, sardines, tuna, flaxseed oil, and ground flaxseeds.  Limit saturated fats. These are in animal products such as meats, butter, and cream. They can also be in plant products such as palm oil, palm kernel oil, and coconut oil.  Avoid foods with partially hydrogenated oils in them. These contain trans fats. Examples of foods that have trans fats are stick margarine, some tub margarines, cookies, crackers, and other baked goods. What general guidelines do I need to follow?  Check food labels. Look for the words "trans fat" and "saturated fat."  When preparing a meal: ? Fill half of your plate with vegetables and green salads. ? Fill one fourth of your plate with whole grains. Look for the word "whole" as the first word in the ingredient list. ? Fill one fourth of your plate with lean protein foods.  Eat more foods that have fiber, like apples, carrots, beans, peas, and barley.  Eat more home-cooked foods. Eat less at restaurants and buffets.  Limit or avoid alcohol.  Limit foods high in starch and sugar.  Limit fried foods.  Cook foods without frying them. Baking, boiling, grilling, and broiling are all great options.  Lose weight if you are overweight. Losing even a small amount of weight can help your overall health. It can also help prevent diseases such as diabetes and heart disease. What foods can I eat? Grains Whole grains, such as whole wheat or whole grain breads, crackers, cereals, and pasta. Unsweetened oatmeal,  bulgur, barley, quinoa, or brown rice. Corn or whole wheat flour tortillas. Vegetables Fresh or frozen vegetables (raw, steamed, roasted, or grilled). Green salads. Fruits All fresh, canned (in natural juice), or frozen fruits. Meat and Other Protein Products Ground beef (85% or leaner), grass-fed beef, or beef trimmed of fat. Skinless chicken or [place]. Ground chicken or [place]. Pork trimmed of fat. All fish and seafood. Eggs. Dried beans, peas, or lentils. Unsalted nuts or seeds. Unsalted canned or dry beans. Dairy Low-fat dairy products, such as skim or 1% milk, 2% or reduced-fat cheeses, low-fat ricotta or cottage cheese, or plain low-fat yogurt. Fats and Oils Tub margarines without trans fats. Light or reduced-fat mayonnaise and salad dressings. Avocado. Olive, canola, sesame, or safflower oils. Natural peanut or almond butter (choose ones without added sugar and oil). The items listed above may not be a complete list of recommended foods or beverages. Contact your dietitian for more options. What foods are not recommended? Grains White bread. White pasta. White rice. Cornbread. Bagels, pastries, and croissants. Crackers that contain trans fat. Vegetables White potatoes. Corn. Creamed or fried vegetables. Vegetables in a cheese sauce. Fruits Dried fruits. Canned fruit in light or heavy syrup. Fruit juice. Meat and Other Protein Products Fatty cuts of meat. Ribs, chicken wings, bacon, sausage, bologna, salami, chitterlings, fatback, hot dogs, bratwurst, and packaged luncheon meats. Liver and organ meats. Dairy Whole or 2% milk, cream, half-and-half, and cream cheese. Whole milk cheeses. Whole-fat or sweetened yogurt. Full-fat cheeses. Nondairy creamers and whipped toppings. Processed cheese, cheese spreads, or cheese curds. Sweets and Desserts Corn  syrup, sugars, honey, and molasses. Candy. Jam and jelly. Syrup. Sweetened cereals. Cookies, pies, cakes, donuts, muffins, and ice  cream. Fats and Oils Butter, stick margarine, lard, shortening, ghee, or bacon fat. Coconut, palm kernel, or palm oils. Beverages Alcohol. Sweetened drinks (such as sodas, lemonade, and fruit drinks or punches). The items listed above may not be a complete list of foods and beverages to avoid. Contact your dietitian for more information. This information is not intended to replace advice given to you by your health care provider. Make sure you discuss any questions you have with your health care provider. Document Released: 02/19/2012 Document Revised: 04/26/2016 Document Reviewed: 11/19/2013 Elsevier Interactive Patient Education  2018 [place] Maintenance, Female Adopting a healthy lifestyle and getting preventive care can go a long way to promote health and wellness. Talk with your health care provider about what schedule of regular examinations is right for you. This is a good chance for you to check in with your provider about disease prevention and staying healthy. In between checkups, there are plenty of things you can do on your own. Experts have done a lot of research about which lifestyle changes and preventive measures are most likely to keep you healthy. Ask your health care provider for more information. Weight and diet Eat a healthy diet  Be sure to include plenty of vegetables, fruits, low-fat dairy products, and lean protein.  Do not eat a lot of foods high in solid fats, added sugars, or salt.  Get regular exercise. This is one of the most important things you can do for your health. ? Most adults should exercise for at least 150 minutes each week. The exercise should increase your heart rate and make you sweat (moderate-intensity exercise). ? Most adults should also do strengthening exercises at least twice a week. This is in addition to the moderate-intensity exercise.  Maintain a healthy weight  Body mass index (BMI) is a measurement that can be used to  identify possible weight problems. It estimates body fat based on height and weight. Your health care provider can help determine your BMI and help you achieve or maintain a healthy weight.  For females 9 years of age and older: ? A BMI below 18.5 is considered underweight. ? A BMI of 18.5 to 24.9 is normal. ? A BMI of 25 to 29.9 is considered overweight. ? A BMI of 30 and above is considered obese.  Watch levels of cholesterol and blood lipids  You should start having your blood tested for lipids and cholesterol at 49 years of age, then have this test every 5 years.  You may need to have your cholesterol levels checked more often if: ? Your lipid or cholesterol levels are high. ? You are older than 49 years of age. ? You are at high risk for heart disease.  Cancer screening Lung Cancer  Lung cancer screening is recommended for adults 27-78 years old who are at high risk for lung cancer because of a history of smoking.  A yearly low-dose CT scan of the lungs is recommended for people who: ? Currently smoke. ? Have quit within the past 15 years. ? Have at least a 30-pack-year history of smoking. A pack year is smoking an average of one pack of cigarettes a day for 1 year.  Yearly screening should continue until it has been 15 years since you quit.  Yearly screening should stop if you develop a health problem that would prevent you from having  lung cancer treatment.  Breast Cancer  Practice breast self-awareness. This means understanding how your breasts normally appear and feel.  It also means doing regular breast self-exams. Let your health care provider know about any changes, no matter how small.  If you are in your 20s or 30s, you should have a clinical breast exam (CBE) by a health care provider every 1-3 years as part of a regular health exam.  If you are 59 or older, have a CBE every year. Also consider having a breast X-ray (mammogram) every year.  If you have a  family history of breast cancer, talk to your health care provider about genetic screening.  If you are at high risk for breast cancer, talk to your health care provider about having an MRI and a mammogram every year.  Breast cancer gene (BRCA) assessment is recommended for women who have family members with BRCA-related cancers. BRCA-related cancers include: ? Breast. ? Ovarian. ? Tubal. ? Peritoneal cancers.  Results of the assessment will determine the need for genetic counseling and BRCA1 and BRCA2 testing.  Cervical Cancer Your health care provider may recommend that you be screened regularly for cancer of the pelvic organs (ovaries, uterus, and vagina). This screening involves a pelvic examination, including checking for microscopic changes to the surface of your cervix (Pap test). You may be encouraged to have this screening done every 3 years, beginning at age 38.  For women ages 79-65, health care providers may recommend pelvic exams and Pap testing every 3 years, or they may recommend the Pap and pelvic exam, combined with testing for human papilloma virus (HPV), every 5 years. Some types of HPV increase your risk of cervical cancer. Testing for HPV may also be done on women of any age with unclear Pap test results.  Other health care providers may not recommend any screening for nonpregnant women who are considered low risk for pelvic cancer and who do not have symptoms. Ask your health care provider if a screening pelvic exam is right for you.  If you have had past treatment for cervical cancer or a condition that could lead to cancer, you need Pap tests and screening for cancer for at least 20 years after your treatment. If Pap tests have been discontinued, your risk factors (such as having a new sexual partner) need to be reassessed to determine if screening should resume. Some women have medical problems that increase the chance of getting cervical cancer. In these cases, your  health care provider may recommend more frequent screening and Pap tests.  Colorectal Cancer  This type of cancer can be detected and often prevented.  Routine colorectal cancer screening usually begins at 49 years of age and continues through 49 years of age.  Your health care provider may recommend screening at an earlier age if you have risk factors for colon cancer.  Your health care provider may also recommend using home test kits to check for hidden blood in the stool.  A small camera at the end of a tube can be used to examine your colon directly (sigmoidoscopy or colonoscopy). This is done to check for the earliest forms of colorectal cancer.  Routine screening usually begins at age 71.  Direct examination of the colon should be repeated every 5-10 years through 49 years of age. However, you may need to be screened more often if early forms of precancerous polyps or small growths are found.  Skin Cancer  Check your skin from head to  toe regularly.  Tell your health care provider about any new moles or changes in moles, especially if there is a change in a mole's shape or color.  Also tell your health care provider if you have a mole that is larger than the size of a pencil eraser.  Always use sunscreen. Apply sunscreen liberally and repeatedly throughout the day.  Protect yourself by wearing long sleeves, pants, a wide-brimmed hat, and sunglasses whenever you are outside.  Heart disease, diabetes, and high blood pressure  High blood pressure causes heart disease and increases the risk of stroke. High blood pressure is more likely to develop in: ? People who have blood pressure in the high end of the normal range (130-139/85-89 mm Hg). ? People who are overweight or obese. ? People who are African American.  If you are 33-39 years of age, have your blood pressure checked every 3-5 years. If you are 63 years of age or older, have your blood pressure checked every year. You  should have your blood pressure measured twice-once when you are at a hospital or clinic, and once when you are not at a hospital or clinic. Record the average of the two measurements. To check your blood pressure when you are not at a hospital or clinic, you can use: ? An automated blood pressure machine at a pharmacy. ? A home blood pressure monitor.  If you are between 52 years and 79 years old, ask your health care provider if you should take aspirin to prevent strokes.  Have regular diabetes screenings. This involves taking a blood sample to check your fasting blood sugar level. ? If you are at a normal weight and have a low risk for diabetes, have this test once every three years after 49 years of age. ? If you are overweight and have a high risk for diabetes, consider being tested at a younger age or more often. Preventing infection Hepatitis B  If you have a higher risk for hepatitis B, you should be screened for this virus. You are considered at high risk for hepatitis B if: ? You were born in a country where hepatitis B is common. Ask your health care provider which countries are considered high risk. ? Your parents were born in a high-risk country, and you have not been immunized against hepatitis B (hepatitis B vaccine). ? You have HIV or AIDS. ? You use needles to inject street drugs. ? You live with someone who has hepatitis B. ? You have had sex with someone who has hepatitis B. ? You get hemodialysis treatment. ? You take certain medicines for conditions, including cancer, organ transplantation, and autoimmune conditions.  Hepatitis C  Blood testing is recommended for: ? Everyone born from [birth year] through 1967. ? Anyone with known risk factors for hepatitis C.  Sexually transmitted infections (STIs)  You should be screened for sexually transmitted infections (STIs) including gonorrhea and chlamydia if: ? You are sexually active and are younger than 49 years of age. ? You  are older than 49 years of age and your health care provider tells you that you are at risk for this type of infection. ? Your sexual activity has changed since you were last screened and you are at an increased risk for chlamydia or gonorrhea. Ask your health care provider if you are at risk.  If you do not have HIV, but are at risk, it may be recommended that you take a prescription medicine daily to prevent HIV infection.  This is called pre-exposure prophylaxis (PrEP). You are considered at risk if: ? You are sexually active and do not regularly use condoms or know the HIV status of your partner(s). ? You take drugs by injection. ? You are sexually active with a partner who has HIV.  Talk with your health care provider about whether you are at high risk of being infected with HIV. If you choose to begin PrEP, you should first be tested for HIV. You should then be tested every 3 months for as long as you are taking PrEP. Pregnancy  If you are premenopausal and you may become pregnant, ask your health care provider about preconception counseling.  If you may become pregnant, take 400 to 800 micrograms (mcg) of folic acid every day.  If you want to prevent pregnancy, talk to your health care provider about birth control (contraception). Osteoporosis and menopause  Osteoporosis is a disease in which the bones lose minerals and strength with aging. This can result in serious bone fractures. Your risk for osteoporosis can be identified using a bone density scan.  If you are 45 years of age or older, or if you are at risk for osteoporosis and fractures, ask your health care provider if you should be screened.  Ask your health care provider whether you should take a calcium or vitamin D supplement to lower your risk for osteoporosis.  Menopause may have certain physical symptoms and risks.  Hormone replacement therapy may reduce some of these symptoms and risks. Talk to your health care  provider about whether hormone replacement therapy is right for you. Follow these instructions at home:  Schedule regular health, dental, and eye exams.  Stay current with your immunizations.  Do not use any tobacco products including cigarettes, chewing tobacco, or electronic cigarettes.  If you are pregnant, do not drink alcohol.  If you are breastfeeding, limit how much and how often you drink alcohol.  Limit alcohol intake to no more than 1 drink per day for nonpregnant women. One drink equals 12 ounces of beer, 5 ounces of wine, or 1 ounces of hard liquor.  Do not use street drugs.  Do not share needles.  Ask your health care provider for help if you need support or information about quitting drugs.  Tell your health care provider if you often feel depressed.  Tell your health care provider if you have ever been abused or do not feel safe at home. This information is not intended to replace advice given to you by your health care provider. Make sure you discuss any questions you have with your health care provider. Document Released: 03/05/2011 Document Revised: 01/26/2016 Document Reviewed: 05/24/2015 Elsevier Interactive Patient Education  [employer].

## 2017-09-06 NOTE — Progress Notes (Signed)
[name] [birth year] [id number]    History:    Presents for annual exam. Cycles becoming more irregular with increasing hot flushes.. Not sexually active. Denies need for STD screen. Normal Pap and mammogram history.   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 Parents hypertension, mother COPD. 2 children daughter 17 with 2 children doing well, son 85 attending [name] .2016 multiple abdominal hernia repair.  ROS:  A ROS was performed and pertinent positives and negatives are included.  Exam:  Vitals:   09/06/17 1537  BP: 110/80  Weight: 140 lb (63.5 kg)   Body mass index is 26.45 kg/m.   General appearance:  Normal Thyroid:  Symmetrical, normal in size, without palpable masses or nodularity. Respiratory  Auscultation:  Clear without wheezing or rhonchi Cardiovascular  Auscultation:  Regular rate, without rubs, murmurs or gallops  Edema/varicosities:  Not grossly evident Abdominal  Soft,nontender, without masses, guarding or rebound.  Liver/spleen:  No organomegaly noted  Hernia:  None appreciated  Skin  Inspection:  Grossly normal   Breasts: Examined lying and sitting.     Right: Without masses, retractions, discharge or axillary adenopathy.     Left: Without masses, retractions, discharge or axillary adenopathy. Gentitourinary   Inguinal/mons:  Normal without inguinal adenopathy  External genitalia:  Normal  BUS/Urethra/Skene's glands:  Normal  Vagina:  Normal  Cervix:  Normal  Uterus:  normal in size, shape and contour.  Midline and mobile  Adnexa/parametria:     Rt: Without masses or tenderness.   Lt: Without masses or tenderness.  Anus and perineum: Normal  Digital rectal exam: Normal sphincter tone without palpated masses or tenderness  Assessment/Plan:  47 y.o. MWF G3 P2 for annual exam no complaints.  Perimenopausal/ irregular cycles GERD  Plan:  SBE's, continue annual screening mammogram, calcium rich diet, vitamin D 2000 daily encouraged. Encouraged to increase regular exercise, less than 20 g saturated fat daily, and continue fish oil supplement daily. Will return to the office for fasting labs of CBC, CMP, lipid panel. Pap normal 2017, new screening guidelines reviewed. Menopause reviewed. Denies need for contraception.    [name] [facility], 4:40 PM 09/06/2017

## 2017-09-23 ENCOUNTER — Other Ambulatory Visit: Payer: BC Managed Care – PPO

## 2017-09-23 DIAGNOSIS — Z833 Family history of diabetes mellitus: Secondary | ICD-10-CM

## 2017-09-23 DIAGNOSIS — Z13220 Encounter for screening for lipoid disorders: Secondary | ICD-10-CM

## 2017-09-23 DIAGNOSIS — Z01419 Encounter for gynecological examination (general) (routine) without abnormal findings: Secondary | ICD-10-CM

## 2017-09-24 LAB — CBC WITH DIFFERENTIAL/PLATELET
BASOS PCT: 0.5 %
Basophils Absolute: 39 cells/uL (ref 0–200)
Eosinophils Absolute: 179 cells/uL (ref 15–500)
Eosinophils Relative: 2.3 %
HCT: 36.4 % (ref 35.0–45.0)
Hemoglobin: 12.1 g/dL (ref 11.7–15.5)
Lymphs Abs: 2972 cells/uL (ref 850–3900)
MCH: 27.2 pg (ref 27.0–33.0)
MCHC: 33.2 g/dL (ref 32.0–36.0)
MCV: 81.8 fL (ref 80.0–100.0)
MPV: 11.9 fL (ref 7.5–12.5)
Monocytes Relative: 5.9 %
Neutro Abs: 4150 cells/uL (ref 1500–7800)
Neutrophils Relative %: 53.2 %
PLATELETS: 295 10*3/uL (ref 140–400)
RBC: 4.45 10*6/uL (ref 3.80–5.10)
RDW: 14.7 % (ref 11.0–15.0)
TOTAL LYMPHOCYTE: 38.1 %
WBC: 7.8 10*3/uL (ref 3.8–10.8)
WBCMIX: 460 {cells}/uL (ref 200–950)

## 2017-09-24 LAB — COMPREHENSIVE METABOLIC PANEL
AG Ratio: 1.4 (calc) (ref 1.0–2.5)
ALKALINE PHOSPHATASE (APISO): 47 U/L (ref 33–115)
ALT: 13 U/L (ref 6–29)
AST: 16 U/L (ref 10–35)
Albumin: 4.7 g/dL (ref 3.6–5.1)
BUN: 16 mg/dL (ref 7–25)
CHLORIDE: 107 mmol/L (ref 98–110)
CO2: 25 mmol/L (ref 20–32)
CREATININE: 0.77 mg/dL (ref 0.50–1.10)
Calcium: 10.1 mg/dL (ref 8.6–10.2)
GLOBULIN: 3.4 g/dL (ref 1.9–3.7)
Glucose, Bld: 99 mg/dL (ref 65–99)
Potassium: 5.1 mmol/L (ref 3.5–5.3)
Sodium: 144 mmol/L (ref 135–146)
Total Bilirubin: 0.3 mg/dL (ref 0.2–1.2)
Total Protein: 8.1 g/dL (ref 6.1–8.1)

## 2017-09-24 LAB — LIPID PANEL
CHOL/HDL RATIO: 4.3 (calc) (ref <5.0)
Cholesterol: 210 mg/dL — ABNORMAL HIGH (ref <200)
HDL: 49 mg/dL — AB (ref >50)
LDL CHOLESTEROL (CALC): 138 mg/dL — AB
NON-HDL CHOLESTEROL (CALC): 161 mg/dL — AB (ref <130)
TRIGLYCERIDES: 112 mg/dL (ref <150)

## 2017-09-24 LAB — HEMOGLOBIN A1C
Hgb A1c MFr Bld: 6.1 % of total Hgb — ABNORMAL HIGH (ref <5.7)
Mean Plasma Glucose: 128 (calc)
eAG (mmol/L): 7.1 (calc)

## 2018-07-23 ENCOUNTER — Encounter: Payer: Self-pay | Admitting: Women's Health

## 2018-07-23 ENCOUNTER — Ambulatory Visit: Payer: BC Managed Care – PPO | Admitting: Women's Health

## 2018-07-23 VITALS — BP 124/80

## 2018-07-23 DIAGNOSIS — Z113 Encounter for screening for infections with a predominantly sexual mode of transmission: Secondary | ICD-10-CM | POA: Diagnosis not present

## 2018-07-23 DIAGNOSIS — N912 Amenorrhea, unspecified: Secondary | ICD-10-CM

## 2018-07-23 NOTE — Patient Instructions (Signed)

## 2018-07-23 NOTE — Progress Notes (Signed)
47 year old SBF G3, P2 presents with complaint of bilateral breast tenderness for the past week especially at the areola.  No change in routine, injury, change in diet or caffeine intake.  Amenorrheic since 03/2018.  States did take a home UPT this past summer that was negative.  New partner since July, consistent condom use..  Kindergarten teacher.  History of abdominal hernia repair.  No known health problems.  Denies urinary symptoms, abdominal pain, vaginal discharge or fever.  Normal mammogram history.  History of 2 sternal aunts with breast cancer in the 22s and 79s.  Both survivors.  Mammogram 08/06/2017.  Exam: Appears well.  Breast examined sitting and lying position without visible retractions, erythema, dimpling or nipple discharge, no palpable nodules, tenderness mostly in areola but states is not as tender today as it has been.  Bilateral mastodynia Perimenopausal STD screen  Plan: FSH, qualitative hCG, GC/chlamydia, HIV, hep B, C, RPR.  Reviewed most likely perimenopausal with a surge of estrogen causing the breast tenderness.  Reviewed importance of condoms until at least 1 year amenorrheic and for infection control.  Reviewed normality of exam, keep scheduled annual 3D mammogram - call to schedule.

## 2018-07-24 LAB — C. TRACHOMATIS/N. GONORRHOEAE RNA
C. trachomatis RNA, TMA: NOT DETECTED
N. gonorrhoeae RNA, TMA: NOT DETECTED

## 2018-07-24 LAB — HEPATITIS C ANTIBODY
Hepatitis C Ab: NONREACTIVE
SIGNAL TO CUT-OFF: 0.03 (ref <1.00)

## 2018-07-24 LAB — HIV ANTIBODY (ROUTINE TESTING W REFLEX): HIV: NONREACTIVE

## 2018-07-24 LAB — HCG, QUANTITATIVE, PREGNANCY: HCG, Total, QN: <2 m[IU]/mL

## 2018-07-24 LAB — RPR: RPR Ser Ql: NONREACTIVE

## 2018-07-24 LAB — HEPATITIS B SURFACE ANTIGEN: Hepatitis B Surface Ag: NONREACTIVE

## 2018-07-24 LAB — FOLLICLE STIMULATING HORMONE: FSH: 6.3 m[IU]/mL

## 2018-07-25 ENCOUNTER — Other Ambulatory Visit: Payer: Self-pay | Admitting: Women's Health

## 2018-07-25 MED ORDER — MEDROXYPROGESTERONE ACETATE 10 MG PO TABS: 10.0000 mg | ORAL_TABLET | Freq: Every day | ORAL | 0 refills | Status: DC

## 2018-07-29 ENCOUNTER — Ambulatory Visit: Payer: BC Managed Care – PPO | Admitting: Gynecology

## 2018-08-29 ENCOUNTER — Ambulatory Visit: Payer: BC Managed Care – PPO | Admitting: Gynecology

## 2018-09-08 ENCOUNTER — Encounter: Payer: BC Managed Care – PPO | Admitting: Women's Health

## 2018-10-14 ENCOUNTER — Ambulatory Visit: Payer: BC Managed Care – PPO | Admitting: Women's Health

## 2018-10-14 ENCOUNTER — Encounter: Payer: Self-pay | Admitting: Women's Health

## 2018-10-14 VITALS — BP 134/80 | Ht 61.0 in | Wt 147.0 lb

## 2018-10-14 DIAGNOSIS — Z01419 Encounter for gynecological examination (general) (routine) without abnormal findings: Secondary | ICD-10-CM | POA: Diagnosis not present

## 2018-10-14 DIAGNOSIS — Z13220 Encounter for screening for lipoid disorders: Secondary | ICD-10-CM

## 2018-10-14 NOTE — Patient Instructions (Signed)
[facility]  Dr Carlean Purl  [phone]   Health Maintenance for Postmenopausal Women Menopause is a normal process in which your reproductive ability comes to an end. This process happens gradually over a span of months to years, usually between the ages of 3 and 7. Menopause is complete when you have missed 12 consecutive menstrual periods. It is important to talk with your health care provider about some of the most common conditions that affect postmenopausal women, such as heart disease, cancer, and bone loss (osteoporosis). Adopting a healthy lifestyle and getting preventive care can help to promote your health and wellness. Those actions can also lower your chances of developing some of these common conditions. What should I know about menopause? During menopause, you may experience a number of symptoms, such as:  Moderate-to-severe hot flashes.  Night sweats.  Decrease in sex drive.  Mood swings.  Headaches.  Tiredness.  Irritability.  Memory problems.  Insomnia. Choosing to treat or not to treat menopausal changes is an individual decision that you make with your health care provider. What should I know about hormone replacement therapy and supplements? Hormone therapy products are effective for treating symptoms that are associated with menopause, such as hot flashes and night sweats. Hormone replacement carries certain risks, especially as you become older. If you are thinking about using estrogen or estrogen with progestin treatments, discuss the benefits and risks with your health care provider. What should I know about heart disease and stroke? Heart disease, heart attack, and stroke become more likely as you age. This may be due, in part, to the hormonal changes that your body experiences during menopause. These can affect how your body processes dietary fats, triglycerides, and cholesterol. Heart attack and stroke are both medical emergencies. There are many  things that you can do to help prevent heart disease and stroke:  Have your blood pressure checked at least every 1-2 years. High blood pressure causes heart disease and increases the risk of stroke.  If you are 76-70 years old, ask your health care provider if you should take aspirin to prevent a heart attack or a stroke.  Do not use any tobacco products, including cigarettes, chewing tobacco, or electronic cigarettes. If you need help quitting, ask your health care provider.  It is important to eat a healthy diet and maintain a healthy weight. ? Be sure to include plenty of vegetables, fruits, low-fat dairy products, and lean protein. ? Avoid eating foods that are high in solid fats, added sugars, or salt (sodium).  Get regular exercise. This is one of the most important things that you can do for your health. ? Try to exercise for at least 150 minutes each week. The type of exercise that you do should increase your heart rate and make you sweat. This is known as moderate-intensity exercise. ? Try to do strengthening exercises at least twice each week. Do these in addition to the moderate-intensity exercise.  Know your numbers.Ask your health care provider to check your cholesterol and your blood glucose. Continue to have your blood tested as directed by your health care provider.  What should I know about cancer screening? There are several types of cancer. Take the following steps to reduce your risk and to catch any cancer development as early as possible. Breast Cancer  Practice breast self-awareness. ? This means understanding how your breasts normally appear and feel. ? It also means doing regular breast self-exams. Let your health care provider know about any changes,  no matter how small.  If you are 45 or older, have a clinician do a breast exam (clinical breast exam or CBE) every year. Depending on your age, family history, and medical history, it may be recommended that you  also have a yearly breast X-ray (mammogram).  If you have a family history of breast cancer, talk with your health care provider about genetic screening.  If you are at high risk for breast cancer, talk with your health care provider about having an MRI and a mammogram every year.  Breast cancer (BRCA) gene test is recommended for women who have family members with BRCA-related cancers. Results of the assessment will determine the need for genetic counseling and BRCA1 and for BRCA2 testing. BRCA-related cancers include these types: ? Breast. This occurs in males or females. ? Ovarian. ? Tubal. This may also be called fallopian tube cancer. ? Cancer of the abdominal or pelvic lining (peritoneal cancer). ? Prostate. ? Pancreatic. Cervical, Uterine, and Ovarian Cancer Your health care provider may recommend that you be screened regularly for cancer of the pelvic organs. These include your ovaries, uterus, and vagina. This screening involves a pelvic exam, which includes checking for microscopic changes to the surface of your cervix (Pap test).  For women ages 21-65, health care providers may recommend a pelvic exam and a Pap test every three years. For women ages 37-65, they may recommend the Pap test and pelvic exam, combined with testing for human papilloma virus (HPV), every five years. Some types of HPV increase your risk of cervical cancer. Testing for HPV may also be done on women of any age who have unclear Pap test results.  Other health care providers may not recommend any screening for nonpregnant women who are considered low risk for pelvic cancer and have no symptoms. Ask your health care provider if a screening pelvic exam is right for you.  If you have had past treatment for cervical cancer or a condition that could lead to cancer, you need Pap tests and screening for cancer for at least 20 years after your treatment. If Pap tests have been discontinued for you, your risk factors (such  as having a new sexual partner) need to be reassessed to determine if you should start having screenings again. Some women have medical problems that increase the chance of getting cervical cancer. In these cases, your health care provider may recommend that you have screening and Pap tests more often.  If you have a family history of uterine cancer or ovarian cancer, talk with your health care provider about genetic screening.  If you have vaginal bleeding after reaching menopause, tell your health care provider.  There are currently no reliable tests available to screen for ovarian cancer. Lung Cancer Lung cancer screening is recommended for adults 29-35 years old who are at high risk for lung cancer because of a history of smoking. A yearly low-dose CT scan of the lungs is recommended if you:  Currently smoke.  Have a history of at least 30 pack-years of smoking and you currently smoke or have quit within the past 15 years. A pack-year is smoking an average of one pack of cigarettes per day for one year. Yearly screening should:  Continue until it has been 15 years since you quit.  Stop if you develop a health problem that would prevent you from having lung cancer treatment. Colorectal Cancer  This type of cancer can be detected and can often be prevented.  Routine colorectal  cancer screening usually begins at age 58 and continues through age 58.  If you have risk factors for colon cancer, your health care provider may recommend that you be screened at an earlier age.  If you have a family history of colorectal cancer, talk with your health care provider about genetic screening.  Your health care provider may also recommend using home test kits to check for hidden blood in your stool.  A small camera at the end of a tube can be used to examine your colon directly (sigmoidoscopy or colonoscopy). This is done to check for the earliest forms of colorectal cancer.  Direct examination  of the colon should be repeated every 5-10 years until age 79. However, if early forms of precancerous polyps or small growths are found or if you have a family history or genetic risk for colorectal cancer, you may need to be screened more often. Skin Cancer  Check your skin from head to toe regularly.  Monitor any moles. Be sure to tell your health care provider: ? About any new moles or changes in moles, especially if there is a change in a mole's shape or color. ? If you have a mole that is larger than the size of a pencil eraser.  If any of your family members has a history of skin cancer, especially at a  age, talk with your health care provider about genetic screening.  Always use sunscreen. Apply sunscreen liberally and repeatedly throughout the day.  Whenever you are outside, protect yourself by wearing long sleeves, pants, a wide-brimmed hat, and sunglasses. What should I know about osteoporosis? Osteoporosis is a condition in which bone destruction happens more quickly than new bone creation. After menopause, you may be at an increased risk for osteoporosis. To help prevent osteoporosis or the bone fractures that can happen because of osteoporosis, the following is recommended:  If you are 23-62 years old, get at least 1,000 mg of calcium and at least 600 mg of vitamin D per day.  If you are older than age 41 but er than age 14, get at least 1,200 mg of calcium and at least 600 mg of vitamin D per day.  If you are older than age 57, get at least 1,200 mg of calcium and at least 800 mg of vitamin D per day. Smoking and excessive alcohol intake increase the risk of osteoporosis. Eat foods that are rich in calcium and vitamin D, and do weight-bearing exercises several times each week as directed by your health care provider. What should I know about how menopause affects my mental health? Depression may occur at any age, but it is more common as you become older. Common  symptoms of depression include:  Low or sad mood.  Changes in sleep patterns.  Changes in appetite or eating patterns.  Feeling an overall lack of motivation or enjoyment of activities that you previously enjoyed.  Frequent crying spells. Talk with your health care provider if you think that you are experiencing depression. What should I know about immunizations? It is important that you get and maintain your immunizations. These include:  Tetanus, diphtheria, and pertussis (Tdap) booster vaccine.  Influenza every year before the flu season begins.  Pneumonia vaccine.  Shingles vaccine. Your health care provider may also recommend other immunizations. This information is not intended to replace advice given to you by your health care provider. Make sure you discuss any questions you have with your health care provider. Document Released: 10/12/2005  Document Revised: 03/09/2016 Document Reviewed: 05/24/2015 Elsevier Interactive Patient Education  [employer].

## 2018-10-14 NOTE — Progress Notes (Signed)
[name] 01/15/1971 [id number]    History:    Presents for annual exam.  Irregular cycles for the past year last cycle November, numerous hot flushes.  Same partner with negative STD screen.  Normal Pap and mammogram history.  2016 numerous abdominal hernia repair.  Past medical history, past surgical history, family history and social history were all reviewed and documented in the EPIC chart.  Kindergarten Pharmacist, hospital.  2 children both doing well.  Parents hypertension, mother COPD.  2 maternal aunts with breast cancer in their 70s and 69s.  ROS:  A ROS was performed and pertinent positives and negatives are included.  Exam:  Vitals:   10/14/18 1519  BP: 134/80  Weight: 147 lb (66.7 kg)  Height: 5\' 1"  (1.549 m)   Body mass index is 27.78 kg/m.   General appearance:  Normal Thyroid:  Symmetrical, normal in size, without palpable masses or nodularity. Respiratory  Auscultation:  Clear without wheezing or rhonchi Cardiovascular  Auscultation:  Regular rate, without rubs, murmurs or gallops  Edema/varicosities:  Not grossly evident Abdominal  Soft,nontender, without masses, guarding or rebound.  Liver/spleen:  No organomegaly noted  Hernia:  None appreciated  Skin  Inspection:  Grossly normal   Breasts: Examined lying and sitting.     Right: Without masses, retractions, discharge or axillary adenopathy.     Left: Without masses, retractions, discharge or axillary adenopathy. Gentitourinary   Inguinal/mons:  Normal without inguinal adenopathy  External genitalia:  Normal  BUS/Urethra/Skene's glands:  Normal  Vagina:  Normal  Cervix:  Normal  Uterus: normal in size, shape and contour.  Midline and mobile  Adnexa/parametria:     Rt: Without masses or tenderness.   Lt: Without masses or tenderness.  Anus and perineum: Normal  Digital rectal exam: Normal sphincter tone without palpated masses or tenderness  Assessment/Plan:  48 y.o. SBF G3, P2 for annual exam with no  complaints.  Perimenopausal Obesity  Plan: Menopause reviewed, instructed to call if abnormal bleeding.  Reviewed importance of decreasing calorie/carbs, increasing regular cardio and weightbearing type exercise.  SBEs, overdue for annual mammogram instructed to schedule.  Screening colonoscopy reviewed [facility] information given instructed to schedule.  Will return to office fasting for CBC, CMP, lipid panel.  Pap with HR HPV typing, new screening guidelines reviewed.    [place], 4:04 PM 10/14/2018

## 2018-10-20 LAB — PAP, TP IMAGING W/ HPV RNA, RFLX HPV TYPE 16,18/45: HPV DNA HIGH RISK: DETECTED — AB

## 2018-10-20 LAB — HPV TYPE 16 AND 18/45 RNA
HPV TYPE 18/45 RNA: NOT DETECTED
HPV Type 16 RNA: NOT DETECTED

## 2018-10-21 ENCOUNTER — Other Ambulatory Visit: Payer: BC Managed Care – PPO

## 2018-10-21 DIAGNOSIS — Z13220 Encounter for screening for lipoid disorders: Secondary | ICD-10-CM

## 2018-10-21 DIAGNOSIS — Z01419 Encounter for gynecological examination (general) (routine) without abnormal findings: Secondary | ICD-10-CM

## 2018-10-23 ENCOUNTER — Telehealth: Payer: Self-pay

## 2018-10-23 NOTE — Telephone Encounter (Signed)
Patient sent e-mail wanting to talk with [place] about her Pap smear result. I left message to call and will be happy to discuss as [place] is off Th/Fri.

## 2018-10-24 DIAGNOSIS — R7309 Other abnormal glucose: Secondary | ICD-10-CM

## 2018-10-24 LAB — COMPREHENSIVE METABOLIC PANEL
AG Ratio: 1.4 (calc) (ref 1.0–2.5)
ALT: 15 U/L (ref 6–29)
AST: 18 U/L (ref 10–35)
Albumin: 4.5 g/dL (ref 3.6–5.1)
Alkaline phosphatase (APISO): 45 U/L (ref 31–125)
BILIRUBIN TOTAL: 0.5 mg/dL (ref 0.2–1.2)
BUN: 10 mg/dL (ref 7–25)
CALCIUM: 9.8 mg/dL (ref 8.6–10.2)
CO2: 28 mmol/L (ref 20–32)
CREATININE: 0.77 mg/dL (ref 0.50–1.10)
Chloride: 105 mmol/L (ref 98–110)
Globulin: 3.3 g/dL (calc) (ref 1.9–3.7)
Glucose, Bld: 105 mg/dL — ABNORMAL HIGH (ref 65–99)
Potassium: 4.7 mmol/L (ref 3.5–5.3)
Sodium: 142 mmol/L (ref 135–146)
TOTAL PROTEIN: 7.8 g/dL (ref 6.1–8.1)

## 2018-10-24 LAB — CBC WITH DIFFERENTIAL/PLATELET
Absolute Monocytes: 555 cells/uL (ref 200–950)
Basophils Absolute: 30 cells/uL (ref 0–200)
Basophils Relative: 0.5 %
EOS PCT: 1.9 %
Eosinophils Absolute: 112 cells/uL (ref 15–500)
HCT: 36.8 % (ref 35.0–45.0)
Hemoglobin: 12.1 g/dL (ref 11.7–15.5)
LYMPHS ABS: 1853 {cells}/uL (ref 850–3900)
MCH: 27.3 pg (ref 27.0–33.0)
MCHC: 32.9 g/dL (ref 32.0–36.0)
MCV: 83.1 fL (ref 80.0–100.0)
MONOS PCT: 9.4 %
MPV: 11.9 fL (ref 7.5–12.5)
Neutro Abs: 3351 cells/uL (ref 1500–7800)
Neutrophils Relative %: 56.8 %
PLATELETS: 241 10*3/uL (ref 140–400)
RBC: 4.43 10*6/uL (ref 3.80–5.10)
RDW: 14.6 % (ref 11.0–15.0)
Total Lymphocyte: 31.4 %
WBC: 5.9 10*3/uL (ref 3.8–10.8)

## 2018-10-24 LAB — LIPID PANEL
CHOL/HDL RATIO: 3.9 (calc) (ref <5.0)
Cholesterol: 170 mg/dL (ref <200)
HDL: 44 mg/dL — ABNORMAL LOW (ref >=50)
LDL CHOLESTEROL (CALC): 99 mg/dL
NON-HDL CHOLESTEROL (CALC): 126 mg/dL (ref <130)
Triglycerides: 177 mg/dL — ABNORMAL HIGH (ref <150)

## 2018-10-24 LAB — HEMOGLOBIN A1C
EAG (MMOL/L): 7 (calc)
Hgb A1c MFr Bld: 6 % of total Hgb — ABNORMAL HIGH (ref <5.7)
MEAN PLASMA GLUCOSE: 126 (calc)

## 2018-10-24 LAB — TEST AUTHORIZATION

## 2018-10-28 ENCOUNTER — Encounter: Payer: Self-pay | Admitting: Women's Health

## 2018-10-28 ENCOUNTER — Ambulatory Visit: Payer: BC Managed Care – PPO | Admitting: Women's Health

## 2018-10-28 VITALS — BP 130/80

## 2018-10-28 DIAGNOSIS — R350 Frequency of micturition: Secondary | ICD-10-CM | POA: Diagnosis not present

## 2018-10-28 MED ORDER — SULFAMETHOXAZOLE-TRIMETHOPRIM 800-160 MG PO TABS
1.0000 | ORAL_TABLET | Freq: Two times a day (BID) | ORAL | 0 refills | Status: DC
Start: 2018-10-28 — End: 2018-10-30

## 2018-10-28 NOTE — Progress Notes (Signed)
48 year old SBF G3, P2 presents with complaint of increased urinary frequency, urgency and pain at end of stream of urination for the past 2 days.  States also started cycle 3 days ago after no cycle for 3 months.  States did have some breast tenderness with mild cramping as with normal cycles.  Denies back pain, nausea, vaginal discharge with itching/ odor or fever.  Same partner, negative STD screen.  Annual exam last week hemoglobin A1c 6.1.  No known health problems.  Exam: Appears well.  No CVAT.  Abdomen soft nontender, external genitalia within normal limits, speculum exam scant menses type blood visible.  Bimanual no CMT or adnexal tenderness. UA: +3 blood, positive nitrites, +2 leukocytes, packed WBCs, packed RBCs, 6-10 squamous epithelials,  many bacteria.  UTI Perimenopausal At risk for type 2 diabetes  Plan: Septra twice daily for 3 days, UTI prevention discussed.  Instructed to call if symptoms persist.  Menopause reviewed, occasional hot flushes, tolerating.  Reviewed importance of increasing regular cardio type exercise, low-carb/sugar diet.  Avoid soft drinks and sweet tea.  Encouraged to make some lifestyle changes and repeat hemoglobin A1c in 3 months.

## 2018-10-28 NOTE — Patient Instructions (Signed)
Carbohydrate Counting for Diabetes Mellitus, Adult  Carbohydrate counting is a method of keeping track of how many carbohydrates you eat. Eating carbohydrates naturally increases the amount of sugar (glucose) in the blood. Counting how many carbohydrates you eat helps keep your blood glucose within normal limits, which helps you manage your diabetes (diabetes mellitus). It is important to know how many carbohydrates you can safely have in each meal. This is different for every person. A diet and nutrition specialist (registered dietitian) can help you make a meal plan and calculate how many carbohydrates you should have at each meal and snack. Carbohydrates are found in the following foods:  Grains, such as breads and cereals.  Dried beans and soy products.  Starchy vegetables, such as potatoes, peas, and corn.  Fruit and fruit juices.  Milk and yogurt.  Sweets and snack foods, such as cake, cookies, candy, chips, and soft drinks. How do I count carbohydrates? There are two ways to count carbohydrates in food. You can use either of the methods or a combination of both. Reading "Nutrition Facts" on packaged food The "Nutrition Facts" list is included on the labels of almost all packaged foods and beverages in the [place] It includes:  The serving size.  Information about nutrients in each serving, including the grams (g) of carbohydrate per serving. To use the "Nutrition Facts":  Decide how many servings you will have.  Multiply the number of servings by the number of carbohydrates per serving.  The resulting number is the total amount of carbohydrates that you will be having. Learning standard serving sizes of other foods When you eat carbohydrate foods that are not packaged or do not include "Nutrition Facts" on the label, you need to measure the servings in order to count the amount of carbohydrates:  Measure the foods that you will eat with a food scale or measuring cup, if  needed.  Decide how many standard-size servings you will eat.  Multiply the number of servings by 15. Most carbohydrate-rich foods have about 15 g of carbohydrates per serving. ? For example, if you eat 8 oz (170 g) of strawberries, you will have eaten 2 servings and 30 g of carbohydrates (2 servings x 15 g = 30 g).  For foods that have more than one food mixed, such as soups and casseroles, you must count the carbohydrates in each food that is included. The following list contains standard serving sizes of common carbohydrate-rich foods. Each of these servings has about 15 g of carbohydrates:   hamburger bun or  English muffin.   oz (15 mL) syrup.   oz (14 g) jelly.  1 slice of bread.  1 six-inch tortilla.  3 oz (85 g) cooked rice or pasta.  4 oz (113 g) cooked dried beans.  4 oz (113 g) starchy vegetable, such as peas, corn, or potatoes.  4 oz (113 g) hot cereal.  4 oz (113 g) mashed potatoes or  of a large baked potato.  4 oz (113 g) canned or frozen fruit.  4 oz (120 mL) fruit juice.  4-6 crackers.  6 chicken nuggets.  6 oz (170 g) unsweetened dry cereal.  6 oz (170 g) plain fat-free yogurt or yogurt sweetened with artificial sweeteners.  8 oz (240 mL) milk.  8 oz (170 g) fresh fruit or one small piece of fruit.  24 oz (680 g) popped popcorn. Example of carbohydrate counting Sample meal  3 oz (85 g) chicken breast.  6 oz (170 g)   brown rice.  4 oz (113 g) corn.  8 oz (240 mL) milk.  8 oz (170 g) strawberries with sugar-free whipped topping. Carbohydrate calculation 1. Identify the foods that contain carbohydrates: ? Rice. ? Corn. ? Milk. ? Strawberries. 2. Calculate how many servings you have of each food: ? 2 servings rice. ? 1 serving corn. ? 1 serving milk. ? 1 serving strawberries. 3. Multiply each number of servings by 15 g: ? 2 servings rice x 15 g = 30 g. ? 1 serving corn x 15 g = 15 g. ? 1 serving milk x 15 g = 15 g. ? 1  serving strawberries x 15 g = 15 g. 4. Add together all of the amounts to find the total grams of carbohydrates eaten: ? 30 g + 15 g + 15 g + 15 g = 75 g of carbohydrates total. Summary  Carbohydrate counting is a method of keeping track of how many carbohydrates you eat.  Eating carbohydrates naturally increases the amount of sugar (glucose) in the blood.  Counting how many carbohydrates you eat helps keep your blood glucose within normal limits, which helps you manage your diabetes.  A diet and nutrition specialist (registered dietitian) can help you make a meal plan and calculate how many carbohydrates you should have at each meal and snack. This information is not intended to replace advice given to you by your health care provider. Make sure you discuss any questions you have with your health care provider. Document Released: 08/20/2005 Document Revised: 02/27/2017 Document Reviewed: 02/01/2016 Elsevier Interactive Patient Education  2019 [employer] Urinary Tract Infection, Adult A urinary tract infection (UTI) is an infection of any part of the urinary tract. The urinary tract includes:  The kidneys.  The ureters.  The bladder.  The urethra. These organs make, store, and get rid of pee (urine) in the body. What are the causes? This is caused by germs (bacteria) in your genital area. These germs grow and cause swelling (inflammation) of your urinary tract. What increases the risk? You are more likely to develop this condition if:  You have a small, thin tube (catheter) to drain pee.  You cannot control when you pee or poop (incontinence).  You are female, and: ? You use these methods to prevent pregnancy: ? A medicine that kills sperm (spermicide). ? A device that blocks sperm (diaphragm). ? You have low levels of a female hormone (estrogen). ? You are pregnant.  You have genes that add to your risk.  You are sexually active.  You take antibiotic  medicines.  You have trouble peeing because of: ? A prostate that is bigger than normal, if you are female. ? A blockage in the part of your body that drains pee from the bladder (urethra). ? A kidney stone. ? A nerve condition that affects your bladder (neurogenic bladder). ? Not getting enough to drink. ? Not peeing often enough.  You have other conditions, such as: ? Diabetes. ? A weak disease-fighting system (immune system). ? Sickle cell disease. ? Gout. ? Injury of the spine. What are the signs or symptoms? Symptoms of this condition include:  Needing to pee right away (urgently).  Peeing often.  Peeing small amounts often.  Pain or burning when peeing.  Blood in the pee.  Pee that smells bad or not like normal.  Trouble peeing.  Pee that is cloudy.  Fluid coming from the vagina, if you are female.  Pain in the belly or lower  back. Other symptoms include:  Throwing up (vomiting).  No urge to eat.  Feeling mixed up (confused).  Being tired and grouchy (irritable).  A fever.  Watery poop (diarrhea). How is this treated? This condition may be treated with:  Antibiotic medicine.  Other medicines.  Drinking enough water. Follow these instructions at home:  Medicines  Take over-the-counter and prescription medicines only as told by your doctor.  If you were prescribed an antibiotic medicine, take it as told by your doctor. Do not stop taking it even if you start to feel better. General instructions  Make sure you: ? Pee until your bladder is empty. ? Do not hold pee for a long time. ? Empty your bladder after sex. ? Wipe from front to back after pooping if you are a female. Use each tissue one time when you wipe.  Drink enough fluid to keep your pee pale yellow.  Keep all follow-up visits as told by your doctor. This is important. Contact a doctor if:  You do not get better after 1-2 days.  Your symptoms go away and then come back. Get  help right away if:  You have very bad back pain.  You have very bad pain in your lower belly.  You have a fever.  You are sick to your stomach (nauseous).  You are throwing up. Summary  A urinary tract infection (UTI) is an infection of any part of the urinary tract.  This condition is caused by germs in your genital area.  There are many risk factors for a UTI. These include having a small, thin tube to drain pee and not being able to control when you pee or poop.  Treatment includes antibiotic medicines for germs.  Drink enough fluid to keep your pee pale yellow. This information is not intended to replace advice given to you by your health care provider. Make sure you discuss any questions you have with your health care provider. Document Released: 02/06/2008 Document Revised: 02/27/2018 Document Reviewed: 02/27/2018 Elsevier Interactive Patient Education  2019 [employer].

## 2018-10-30 ENCOUNTER — Other Ambulatory Visit: Payer: Self-pay | Admitting: *Deleted

## 2018-10-30 LAB — URINALYSIS, COMPLETE W/RFL CULTURE
Bilirubin Urine: NEGATIVE
Glucose, UA: NEGATIVE
HYALINE CAST: NONE SEEN /LPF
Ketones, ur: NEGATIVE
Nitrites, Initial: POSITIVE — AB
PH: 5.5 (ref 5.0–8.0)
Specific Gravity, Urine: 1.022 (ref 1.001–1.03)

## 2018-10-30 LAB — URINE CULTURE
MICRO NUMBER:: 244914
SPECIMEN QUALITY: ADEQUATE

## 2018-10-30 LAB — CULTURE INDICATED

## 2018-10-30 MED ORDER — NITROFURANTOIN MONOHYD MACRO 100 MG PO CAPS: 100.0000 mg | ORAL_CAPSULE | Freq: Two times a day (BID) | ORAL | 0 refills | Status: DC

## 2019-05-20 ENCOUNTER — Encounter: Payer: Self-pay | Admitting: Gastroenterology

## 2019-05-27 ENCOUNTER — Encounter: Payer: Self-pay | Admitting: Gynecology

## 2019-05-28 ENCOUNTER — Other Ambulatory Visit: Payer: Self-pay

## 2019-05-28 ENCOUNTER — Ambulatory Visit (AMBULATORY_SURGERY_CENTER): Payer: Self-pay

## 2019-05-28 ENCOUNTER — Encounter: Payer: Self-pay | Admitting: Gastroenterology

## 2019-05-28 VITALS — Temp 96.6°F | Ht 60.0 in | Wt 136.0 lb

## 2019-05-28 DIAGNOSIS — Z800 Family history of malignant neoplasm of digestive organs: Secondary | ICD-10-CM

## 2019-05-28 MED ORDER — SUPREP BOWEL PREP KIT 17.5-3.13-1.6 GM/177ML PO SOLN: 1.0000 | Freq: Once | ORAL | 0 refills | Status: AC

## 2019-05-28 NOTE — Progress Notes (Signed)
Per pt, no allergies to soy or egg products.Pt not taking any weight loss meds or using  O2 at home.  Emmi info given to pt.  Pt states she was hard to wake up past sedation!

## 2019-06-09 ENCOUNTER — Telehealth: Payer: Self-pay | Admitting: Gastroenterology

## 2019-06-09 ENCOUNTER — Telehealth: Payer: Self-pay

## 2019-06-09 NOTE — Telephone Encounter (Signed)

## 2019-06-09 NOTE — Telephone Encounter (Signed)
Covid-19 screening questions   Do you now or have you had a fever in the last 14 days?  Do you have any respiratory symptoms of shortness of breath or cough now or in the last 14 days?  Do you have any family members or close contacts with diagnosed or suspected Covid-19 in the past 14 days?  Have you been tested for Covid-19 and found to be positive?       

## 2019-06-10 ENCOUNTER — Ambulatory Visit (AMBULATORY_SURGERY_CENTER): Payer: BC Managed Care – PPO | Admitting: Gastroenterology

## 2019-06-10 ENCOUNTER — Other Ambulatory Visit: Payer: Self-pay | Admitting: Gastroenterology

## 2019-06-10 ENCOUNTER — Other Ambulatory Visit: Payer: Self-pay

## 2019-06-10 ENCOUNTER — Encounter: Payer: Self-pay | Admitting: Gastroenterology

## 2019-06-10 VITALS — BP 127/84 | HR 71 | Temp 98.4°F | Resp 14 | Ht 60.0 in | Wt 136.0 lb

## 2019-06-10 DIAGNOSIS — Z1211 Encounter for screening for malignant neoplasm of colon: Secondary | ICD-10-CM | POA: Diagnosis not present

## 2019-06-10 DIAGNOSIS — D125 Benign neoplasm of sigmoid colon: Secondary | ICD-10-CM

## 2019-06-10 DIAGNOSIS — K635 Polyp of colon: Secondary | ICD-10-CM | POA: Diagnosis not present

## 2019-06-10 DIAGNOSIS — Z800 Family history of malignant neoplasm of digestive organs: Secondary | ICD-10-CM

## 2019-06-10 MED ORDER — SODIUM CHLORIDE 0.9 % IV SOLN: 500.0000 mL | Freq: Once | INTRAVENOUS | Status: DC

## 2019-06-10 NOTE — Op Note (Signed)
[facility] Patient Name: [name] Procedure Date: 06/10/2019 2:04 PM MRN: [id number] Endoscopist: Ladene Artist , MD Age: 48 Referring MD:  Date of Birth: March 21, 1971 Gender: Female Account #: [id number] Procedure:                Colonoscopy Indications:              Screening in patient at increased risk: Family                            history of 1st-degree relative with colorectal                            cancer Medicines:                Monitored Anesthesia Care Procedure:                Pre-Anesthesia Assessment:                           - Prior to the procedure, a History and Physical                            was performed, and patient medications and                            allergies were reviewed. The patient's tolerance of                            previous anesthesia was also reviewed. The risks                            and benefits of the procedure and the sedation                            options and risks were discussed with the patient.                            All questions were answered, and informed consent                            was obtained. Prior Anticoagulants: The patient has                            taken no previous anticoagulant or antiplatelet                            agents. ASA Grade Assessment: II - A patient with                            mild systemic disease. After reviewing the risks                            and benefits, the patient was deemed in  satisfactory condition to undergo the procedure.                           After obtaining informed consent, the colonoscope                            was passed under direct vision. Throughout the                            procedure, the patient's blood pressure, pulse, and                            oxygen saturations were monitored continuously. The                            Colonoscope was introduced through the anus and                  advanced to the the cecum, identified by                            appendiceal orifice and ileocecal valve. The                            ileocecal valve, appendiceal orifice, and rectum                            were photographed. The quality of the bowel                            preparation was excellent. The colonoscopy was                            performed without difficulty. The patient tolerated                            the procedure well. Scope In: 2:15:57 PM Scope Out: 2:31:08 PM Scope Withdrawal Time: 0 hours 11 minutes 17 seconds  Total Procedure Duration: 0 hours 15 minutes 11 seconds  Findings:                 The perianal and digital rectal examinations were                            normal.                           A 6 mm polyp was found in the sigmoid colon. The                            polyp was sessile. The polyp was removed with a                            cold snare. Resection and retrieval were complete.  A few small-mouthed diverticula were found in the                            left colon.                           The exam was otherwise without abnormality on                            direct and retroflexion views. Complications:            No immediate complications. Estimated blood loss:                            None. Estimated Blood Loss:     Estimated blood loss: none. Impression:               - One 6 mm polyp in the sigmoid colon, removed with                            a cold snare. Resected and retrieved.                           - Mild diverticulosis in the left colon.                           - The examination was otherwise normal on direct                            and retroflexion views. Recommendation:           - Repeat colonoscopy in 5 years for                            surveillance/screening.                           - Patient has a contact number available for                             emergencies. The signs and symptoms of potential                            delayed complications were discussed with the                            patient. Return to normal activities tomorrow.                            Written discharge instructions were provided to the                            patient.                           - Resume previous diet.                           -  Continue present medications.                           - Await pathology results. Ladene Artist, MD 06/10/2019 2:35:14 PM This report has been signed electronically.

## 2019-06-10 NOTE — Progress Notes (Signed)
Report given to PACU, vss 

## 2019-06-10 NOTE — Progress Notes (Signed)
Called to room to assist during endoscopic procedure.  Patient ID and intended procedure confirmed with present staff. Received instructions for my participation in the procedure from the performing physician.  

## 2019-06-10 NOTE — Patient Instructions (Signed)
Handout provided on Polyps.    1 polyp was removed and sent off for pathology.  Wait for the results which can take 10-14 days to receive.  Resume previous medication schedule and diet.  Next colonoscopy should be in 5 years.  YOU HAD AN ENDOSCOPIC PROCEDURE TODAY AT [facility]:   Refer to the procedure report that was given to you for any specific questions about what was found during the examination.  If the procedure report does not answer your questions, please call your gastroenterologist to clarify.  If you requested that your care partner not be given the details of your procedure findings, then the procedure report has been included in a sealed envelope for you to review at your convenience later.  YOU SHOULD EXPECT: Some feelings of bloating in the abdomen. Passage of more gas than usual.  Walking can help get rid of the air that was put into your GI tract during the procedure and reduce the bloating. If you had a lower endoscopy (such as a colonoscopy or flexible sigmoidoscopy) you may notice spotting of blood in your stool or on the toilet paper. If you underwent a bowel prep for your procedure, you may not have a normal bowel movement for a few days.  Please Note:  You might notice some irritation and congestion in your nose or some drainage.  This is from the oxygen used during your procedure.  There is no need for concern and it should clear up in a day or so.  SYMPTOMS TO REPORT IMMEDIATELY:   Following lower endoscopy (colonoscopy or flexible sigmoidoscopy):  Excessive amounts of blood in the stool  Significant tenderness or worsening of abdominal pains  Swelling of the abdomen that is new, acute  Fever of 100F or higher   For urgent or emergent issues, a gastroenterologist can be reached at any hour by calling [phone].   DIET:  We do recommend a small meal at first, but then you may proceed to your regular diet.  Drink plenty of fluids but you  should avoid alcoholic beverages for 24 hours.  ACTIVITY:  You should plan to take it easy for the rest of today and you should NOT DRIVE or use heavy machinery until tomorrow (because of the sedation medicines used during the test).    FOLLOW UP: Our staff will call the number listed on your records 48-72 hours following your procedure to check on you and address any questions or concerns that you may have regarding the information given to you following your procedure. If we do not reach you, we will leave a message.  We will attempt to reach you two times.  During this call, we will ask if you have developed any symptoms of COVID 19. If you develop any symptoms (ie: fever, flu-like symptoms, shortness of breath, cough etc.) before then, please call [phone].  If you test positive for Covid 19 in the 2 weeks post procedure, please call and report this information to [place].    If any biopsies were taken you will be contacted by phone or by letter within the next 1-3 weeks.  Please call us at [phone] if you have not heard about the biopsies in 3 weeks.    SIGNATURES/CONFIDENTIALITY: You and/or your care partner have signed paperwork which will be entered into your electronic medical record.  These signatures attest to the fact that that the information above on your After Visit Summary has been reviewed and is  understood.  Full responsibility of the confidentiality of this discharge information lies with you and/or your care-partner. 

## 2019-06-10 NOTE — Progress Notes (Signed)
Pt's states no medical or surgical changes since previsit or office visit.  Temp taken by KA VS taken by CW

## 2019-06-12 ENCOUNTER — Telehealth: Payer: Self-pay

## 2019-06-12 NOTE — Telephone Encounter (Signed)
Attempted to reach pt. With follow-up call following endoscopic procedure 06/10/2019.   LM on pt. Ans. Machine to call if she has any questions or concerns, or has come down with any symptoms of Covid 19.

## 2019-06-12 NOTE — Telephone Encounter (Signed)
Called [phone] and the answering machine picked up.  Left a message this was a follow up call after her Colonoscopy.

## 2019-06-17 ENCOUNTER — Encounter: Payer: Self-pay | Admitting: Gastroenterology

## 2019-10-16 ENCOUNTER — Other Ambulatory Visit: Payer: Self-pay

## 2019-10-19 ENCOUNTER — Encounter: Payer: Self-pay | Admitting: Women's Health

## 2019-10-19 ENCOUNTER — Other Ambulatory Visit: Payer: Self-pay

## 2019-10-19 ENCOUNTER — Ambulatory Visit: Payer: BC Managed Care – PPO | Admitting: Women's Health

## 2019-10-19 VITALS — BP 138/80 | Ht 60.0 in | Wt 148.0 lb

## 2019-10-19 DIAGNOSIS — Z01419 Encounter for gynecological examination (general) (routine) without abnormal findings: Secondary | ICD-10-CM | POA: Diagnosis not present

## 2019-10-19 DIAGNOSIS — Z13220 Encounter for screening for lipoid disorders: Secondary | ICD-10-CM

## 2019-10-19 MED ORDER — ESTRADIOL 0.05 MG/24HR TD PTTW: 1.0000 | MEDICATED_PATCH | TRANSDERMAL | 4 refills | Status: DC

## 2019-10-19 MED ORDER — PROGESTERONE MICRONIZED 200 MG PO CAPS: ORAL_CAPSULE | ORAL | 4 refills | Status: DC

## 2019-10-19 NOTE — Progress Notes (Signed)
BRITTNEYANN DELAPUENTE [birth year] [id number]    History:    Presents for annual exam.  Amenorrheic x1 year with numerous hot flushes, poor sleep.  Not sexually active, negative STD screen with past partner.  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  Normal mammogram history overdue for mammogram.  06/2019 benign colon polyps 5-year follow-up Father deceased from rectal cancer.  Has 2 maternal aunts with breast cancer.  Past medical history, past surgical history, family history and social history were all reviewed and documented in the EPIC chart.  Kindergarten Pharmacist, hospital.  Daughter with 2 children helping grandson with home schooling, son 73 having some kidney problems, has follow-up scheduled.  11-27-14 numerous abdominal hernia repair.  Parents hypertension, mother deceased from COPD.  ROS:  A ROS was performed and pertinent positives and negatives are included.  Exam:  Vitals:   10/19/19 1507  Weight: 148 lb (67.1 kg)  Height: 5' (1.524 m)   Body mass index is 28.9 kg/m.   General appearance:  Normal Thyroid:  Symmetrical, normal in size, without palpable masses or nodularity. Respiratory  Auscultation:  Clear without wheezing or rhonchi Cardiovascular  Auscultation:  Regular rate, without rubs, murmurs or gallops  Edema/varicosities:  Not grossly evident Abdominal  Soft,nontender, without masses, guarding or rebound.  Liver/spleen:  No organomegaly noted  Hernia:  None appreciated  Skin  Inspection:  Grossly normal   Breasts: Examined lying and sitting.     Right: Without masses, retractions, discharge or axillary adenopathy.     Left: Without masses, retractions, discharge or axillary adenopathy. Gentitourinary   Inguinal/mons:  Normal without inguinal adenopathy  External genitalia:  Normal  BUS/Urethra/Skene's glands:  Normal  Vagina:  Normal  Cervix:  Normal  Uterus:  normal in size, shape and contour.  Midline and mobile  Adnexa/parametria:     Rt: Without  masses or tenderness.   Lt: Without masses or tenderness.  Anus and perineum: Normal  Digital rectal exam: Normal sphincter tone without palpated masses or tenderness  Assessment/Plan:  49 y.o. SBF G3 P2 for annual exam with no complaints of vaginal discharge, urinary symptoms, GI problems or abdominal pain.  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  Instructed to call if problems with bleeding reviewed may get a light withdrawal bleed each month, call if no relief of hot flashes and continued problems with sleep.  Women's health initiative reviewed best to stay on 7 years or less.  SBEs, overdue for mammogram breast center information given instructed to schedule.  Exercise, calcium rich foods, vitamin D 2000 IUs daily and weightbearing/balance type exercise encouraged.  Will return to office fasting for CBC, CMP, lipid panel, Pap.    [name] [facility], 3:11 PM 10/19/2019

## 2019-10-19 NOTE — Patient Instructions (Addendum)
Good to see you today Vitamin D 2000 IUs daily [facility]  [phone]  Menopause and Hormone Replacement Therapy Menopause is a normal time of life when menstrual periods stop completely and the ovaries stop producing the female hormones estrogen and progesterone. This lack of hormones can affect your health and cause undesirable symptoms. Hormone replacement therapy (HRT) can relieve some of those symptoms. What is hormone replacement therapy? HRT is the use of artificial (synthetic) hormones to replace hormones that your body has stopped producing because you have reached menopause. What are my options for HRT?  HRT may consist of the synthetic hormones estrogen and progestin, or it may consist of only estrogen (estrogen-only therapy). You and your health care provider will decide which form of HRT is best for you. If you choose to be on HRT and you have a uterus, estrogen and progestin are usually prescribed. Estrogen-only therapy is used for women who do not have a uterus. Possible options for taking HRT include:  Pills.  Patches.  Gels.  Sprays.  Vaginal cream.  Vaginal rings.  Vaginal inserts. The amount of hormone(s) that you take and how long you take the hormone(s) varies according to your health. It is important to:  Begin HRT with the lowest possible dosage.  Stop HRT as soon as your health care provider tells you to stop.  Work with your health care provider so that you feel informed and comfortable with your decisions. What are the benefits of HRT? HRT can reduce the frequency and severity of menopausal symptoms. Benefits of HRT vary according to the kind of symptoms that you have, how severe they are, and your overall health. HRT may help to improve the following symptoms of menopause:  Hot flashes and night sweats. These are sudden feelings of heat that spread over the face and body. The skin may turn red, like a blush. Night sweats are hot flashes that happen  while you are sleeping or trying to sleep.  Bone loss (osteoporosis). The body loses calcium more quickly after menopause, causing the bones to become weaker. This can increase the risk for bone breaks (fractures).  Vaginal dryness. The lining of the vagina can become thin and dry, which can cause pain during sex or cause infection, burning, or itching.  Urinary tract infections.  Urinary incontinence. This is the inability to control when you pass urine.  Irritability.  Short-term memory problems. What are the risks of HRT? Risks of HRT vary depending on your individual health and medical history. Risks of HRT also depend on whether you receive both estrogen and progestin or you receive estrogen only. HRT may increase the risk of:  Spotting. This is when a small amount of blood leaks from the vagina unexpectedly.  Endometrial cancer. This cancer is in the lining of the uterus (endometrium).  Breast cancer.  Increased density of breast tissue. This can make it harder to find breast cancer on a breast X-ray (mammogram).  Stroke.  Heart disease.  Blood clots.  Gallbladder disease.  Liver disease. Risks of HRT can increase if you have any of the following conditions:  Endometrial cancer.  Liver disease.  Heart disease.  Breast cancer.  History of blood clots.  History of stroke. Follow these instructions at home:  Take over-the-counter and prescription medicines only as told by your health care provider.  Get mammograms, pelvic exams, and medical checkups as often as told by your health care provider.  Have Pap tests done as often as told  by your health care provider. A Pap test is sometimes called a Pap smear. It is a screening test that is used to check for signs of cancer of the cervix and vagina. A Pap test can also identify the presence of infection or precancerous changes. Pap tests may be done: ? Every 3 years, starting at age 57. ? Every 5 years, starting  after age 46, in combination with testing for human papillomavirus (HPV). ? More often or less often depending on other medical conditions you have, your age, and other risk factors.  It is up to you to get the results of your Pap test. Ask your health care provider, or the department that is doing the test, when your results will be ready.  Keep all follow-up visits as told by your health care provider. This is important. Contact a health care provider if you have:  Pain or swelling in your legs.  Shortness of breath.  Chest pain.  Lumps or changes in your breasts or armpits.  Slurred speech.  Pain, burning, or bleeding when you urinate.  Unusual vaginal bleeding.  Dizziness or headaches.  Weakness or numbness in any part of your arms or legs.  Pain in your abdomen. Summary  Menopause is a normal time of life when menstrual periods stop completely and the ovaries stop producing the female hormones estrogen and progesterone.  Hormone replacement therapy (HRT) can relieve some of the symptoms of menopause.  HRT can reduce the frequency and severity of menopausal symptoms.  Risks of HRT vary depending on your individual health and medical history. This information is not intended to replace advice given to you by your health care provider. Make sure you discuss any questions you have with your health care provider. Document Revised: 04/22/2018 Document Reviewed: 04/22/2018 Elsevier Patient Education  2020 [employer] Maintenance, Female Adopting a healthy lifestyle and getting preventive care are important in promoting health and wellness. Ask your health care provider about:  The right schedule for you to have regular tests and exams.  Things you can do on your own to prevent diseases and keep yourself healthy. What should I know about diet, weight, and exercise? Eat a healthy diet   Eat a diet that includes plenty of vegetables, fruits, low-fat dairy  products, and lean protein.  Do not eat a lot of foods that are high in solid fats, added sugars, or sodium. Maintain a healthy weight Body mass index (BMI) is used to identify weight problems. It estimates body fat based on height and weight. Your health care provider can help determine your BMI and help you achieve or maintain a healthy weight. Get regular exercise Get regular exercise. This is one of the most important things you can do for your health. Most adults should:  Exercise for at least 150 minutes each week. The exercise should increase your heart rate and make you sweat (moderate-intensity exercise).  Do strengthening exercises at least twice a week. This is in addition to the moderate-intensity exercise.  Spend less time sitting. Even light physical activity can be beneficial. Watch cholesterol and blood lipids Have your blood tested for lipids and cholesterol at 51 years of age, then have this test every 5 years. Have your cholesterol levels checked more often if:  Your lipid or cholesterol levels are high.  You are older than 51 years of age.  You are at high risk for heart disease. What should I know about cancer screening? Depending on your health  history and family history, you may need to have cancer screening at various ages. This may include screening for:  Breast cancer.  Cervical cancer.  Colorectal cancer.  Skin cancer.  Lung cancer. What should I know about heart disease, diabetes, and high blood pressure? Blood pressure and heart disease  High blood pressure causes heart disease and increases the risk of stroke. This is more likely to develop in people who have high blood pressure readings, are of African descent, or are overweight.  Have your blood pressure checked: ? Every 3-5 years if you are 41-55 years of age. ? Every year if you are 69 years old or older. Diabetes Have regular diabetes screenings. This checks your fasting blood sugar level.  Have the screening done:  Once every three years after age 34 if you are at a normal weight and have a low risk for diabetes.  More often and at a younger age if you are overweight or have a high risk for diabetes. What should I know about preventing infection? Hepatitis B If you have a higher risk for hepatitis B, you should be screened for this virus. Talk with your health care provider to find out if you are at risk for hepatitis B infection. Hepatitis C Testing is recommended for:  Everyone born from [birth year] through 1967.  Anyone with known risk factors for hepatitis C. Sexually transmitted infections (STIs)  Get screened for STIs, including gonorrhea and chlamydia, if: ? You are sexually active and are younger than 51 years of age. ? You are older than 51 years of age and your health care provider tells you that you are at risk for this type of infection. ? Your sexual activity has changed since you were last screened, and you are at increased risk for chlamydia or gonorrhea. Ask your health care provider if you are at risk.  Ask your health care provider about whether you are at high risk for HIV. Your health care provider may recommend a prescription medicine to help prevent HIV infection. If you choose to take medicine to prevent HIV, you should first get tested for HIV. You should then be tested every 3 months for as long as you are taking the medicine. Pregnancy  If you are about to stop having your period (premenopausal) and you may become pregnant, seek counseling before you get pregnant.  Take 400 to 800 micrograms (mcg) of folic acid every day if you become pregnant.  Ask for birth control (contraception) if you want to prevent pregnancy. Osteoporosis and menopause Osteoporosis is a disease in which the bones lose minerals and strength with aging. This can result in bone fractures. If you are 52 years old or older, or if you are at risk for osteoporosis and fractures, ask  your health care provider if you should:  Be screened for bone loss.  Take a calcium or vitamin D supplement to lower your risk of fractures.  Be given hormone replacement therapy (HRT) to treat symptoms of menopause. Follow these instructions at home: Lifestyle  Do not use any products that contain nicotine or tobacco, such as cigarettes, e-cigarettes, and chewing tobacco. If you need help quitting, ask your health care provider.  Do not use street drugs.  Do not share needles.  Ask your health care provider for help if you need support or information about quitting drugs. Alcohol use  Do not drink alcohol if: ? Your health care provider tells you not to drink. ? You are pregnant, may  be pregnant, or are planning to become pregnant.  If you drink alcohol: ? Limit how much you use to 0-1 drink a day. ? Limit intake if you are breastfeeding.  Be aware of how much alcohol is in your drink. In the [place] one drink equals one 12 oz bottle of beer (355 mL), one 5 oz glass of wine (148 mL), or one 1 oz glass of hard liquor (44 mL). General instructions  Schedule regular health, dental, and eye exams.  Stay current with your vaccines.  Tell your health care provider if: ? You often feel depressed. ? You have ever been abused or do not feel safe at home. Summary  Adopting a healthy lifestyle and getting preventive care are important in promoting health and wellness.  Follow your health care provider's instructions about healthy diet, exercising, and getting tested or screened for diseases.  Follow your health care provider's instructions on monitoring your cholesterol and blood pressure. This information is not intended to replace advice given to you by your health care provider. Make sure you discuss any questions you have with your health care provider. Document Revised: 08/13/2018 Document Reviewed: 08/13/2018 Elsevier Patient Education  2020 [employer].

## 2019-10-20 LAB — PAP IG W/ RFLX HPV ASCU

## 2019-10-23 ENCOUNTER — Other Ambulatory Visit: Payer: BC Managed Care – PPO

## 2019-11-13 ENCOUNTER — Other Ambulatory Visit: Payer: BC Managed Care – PPO

## 2019-11-16 ENCOUNTER — Other Ambulatory Visit: Payer: BC Managed Care – PPO

## 2019-11-16 ENCOUNTER — Other Ambulatory Visit: Payer: Self-pay

## 2019-11-16 DIAGNOSIS — Z01419 Encounter for gynecological examination (general) (routine) without abnormal findings: Secondary | ICD-10-CM

## 2019-11-16 DIAGNOSIS — Z13220 Encounter for screening for lipoid disorders: Secondary | ICD-10-CM

## 2019-11-17 LAB — COMPREHENSIVE METABOLIC PANEL
AG Ratio: 1.5 (calc) (ref 1.0–2.5)
ALT: 14 U/L (ref 6–29)
AST: 17 U/L (ref 10–35)
Albumin: 4.6 g/dL (ref 3.6–5.1)
Alkaline phosphatase (APISO): 52 U/L (ref 37–153)
BUN: 12 mg/dL (ref 7–25)
CO2: 27 mmol/L (ref 20–32)
Calcium: 9.8 mg/dL (ref 8.6–10.4)
Chloride: 104 mmol/L (ref 98–110)
Creat: 0.78 mg/dL (ref 0.50–1.05)
Globulin: 3 g/dL (calc) (ref 1.9–3.7)
Glucose, Bld: 99 mg/dL (ref 65–99)
Potassium: 4.3 mmol/L (ref 3.5–5.3)
Sodium: 140 mmol/L (ref 135–146)
Total Bilirubin: 0.6 mg/dL (ref 0.2–1.2)
Total Protein: 7.6 g/dL (ref 6.1–8.1)

## 2019-11-17 LAB — CBC WITH DIFFERENTIAL/PLATELET
Absolute Monocytes: 429 cells/uL (ref 200–950)
Basophils Absolute: 41 cells/uL (ref 0–200)
Basophils Relative: 0.7 %
Eosinophils Absolute: 139 cells/uL (ref 15–500)
Eosinophils Relative: 2.4 %
HCT: 39.3 % (ref 35.0–45.0)
Hemoglobin: 12.7 g/dL (ref 11.7–15.5)
Lymphs Abs: 2065 cells/uL (ref 850–3900)
MCH: 27.3 pg (ref 27.0–33.0)
MCHC: 32.3 g/dL (ref 32.0–36.0)
MCV: 84.5 fL (ref 80.0–100.0)
MPV: 11.8 fL (ref 7.5–12.5)
Monocytes Relative: 7.4 %
Neutro Abs: 3126 cells/uL (ref 1500–7800)
Neutrophils Relative %: 53.9 %
Platelets: 242 10*3/uL (ref 140–400)
RBC: 4.65 10*6/uL (ref 3.80–5.10)
RDW: 14.7 % (ref 11.0–15.0)
Total Lymphocyte: 35.6 %
WBC: 5.8 10*3/uL (ref 3.8–10.8)

## 2019-11-17 LAB — LIPID PANEL
Cholesterol: 213 mg/dL — ABNORMAL HIGH (ref <200)
HDL: 41 mg/dL — ABNORMAL LOW (ref >=50)
LDL Cholesterol (Calc): 140 mg/dL (calc) — ABNORMAL HIGH
Non-HDL Cholesterol (Calc): 172 mg/dL (calc) — ABNORMAL HIGH (ref <130)
Total CHOL/HDL Ratio: 5.2 (calc) — ABNORMAL HIGH (ref <5.0)
Triglycerides: 186 mg/dL — ABNORMAL HIGH (ref <150)

## 2019-12-28 ENCOUNTER — Other Ambulatory Visit: Payer: Self-pay | Admitting: Women's Health

## 2019-12-28 DIAGNOSIS — Z1231 Encounter for screening mammogram for malignant neoplasm of breast: Secondary | ICD-10-CM

## 2020-01-11 ENCOUNTER — Ambulatory Visit
Admission: RE | Admit: 2020-01-11 | Discharge: 2020-01-11 | Disposition: A | Discharge status: Home / Self Care | Payer: BC Managed Care – PPO | Source: Ambulatory Visit | Attending: Women's Health | Admitting: Women's Health

## 2020-01-11 ENCOUNTER — Ambulatory Visit: Payer: BC Managed Care – PPO

## 2020-01-11 ENCOUNTER — Other Ambulatory Visit: Payer: Self-pay

## 2020-01-11 DIAGNOSIS — Z1231 Encounter for screening mammogram for malignant neoplasm of breast: Secondary | ICD-10-CM

## 2020-07-16 ENCOUNTER — Ambulatory Visit: Payer: BC Managed Care – PPO | Attending: Internal Medicine

## 2020-07-16 DIAGNOSIS — Z23 Encounter for immunization: Secondary | ICD-10-CM

## 2020-07-16 NOTE — Progress Notes (Signed)
   Covid-19 Vaccination Clinic  Name:  CLEOLA PERRYMAN    MRN: [id number] DOB: Mar 20, 1971  07/16/2020  Ms. Pearcy was observed post Covid-19 immunization for 15 minutes without incident. She was provided with Vaccine Information Sheet and instruction to access the V-Safe system.   Ms. Lacey was instructed to call 911 with any severe reactions post vaccine: Marland Kitchen Difficulty breathing  . Swelling of face and throat  . A fast heartbeat  . A bad rash all over body  . Dizziness and weakness

## 2020-08-22 ENCOUNTER — Telehealth: Payer: Self-pay | Admitting: *Deleted

## 2020-08-22 ENCOUNTER — Ambulatory Visit: Payer: BC Managed Care – PPO | Admitting: Nurse Practitioner

## 2020-08-22 ENCOUNTER — Encounter: Payer: Self-pay | Admitting: Nurse Practitioner

## 2020-08-22 ENCOUNTER — Other Ambulatory Visit: Payer: Self-pay

## 2020-08-22 VITALS — BP 124/82

## 2020-08-22 DIAGNOSIS — N950 Postmenopausal bleeding: Secondary | ICD-10-CM

## 2020-08-22 DIAGNOSIS — N939 Abnormal uterine and vaginal bleeding, unspecified: Secondary | ICD-10-CM

## 2020-08-22 LAB — TSH: TSH: 3.19 mIU/L

## 2020-08-22 LAB — FOLLICLE STIMULATING HORMONE: FSH: 17.9 m[IU]/mL

## 2020-08-22 MED ORDER — MEGESTROL ACETATE 40 MG PO TABS: 40.0000 mg | ORAL_TABLET | Freq: Two times a day (BID) | ORAL | 1 refills | Status: AC

## 2020-08-22 NOTE — Patient Instructions (Signed)
Postmenopausal Bleeding  Postmenopausal bleeding is any bleeding that occurs after menopause. Menopause is when a woman's period stops. Any type of bleeding after menopause should be checked by your doctor. Treatment will depend on the cause. Follow these instructions at home:  Pay attention to any changes in your symptoms.  Avoid using tampons and douches as told by your doctor.  Change your pads regularly.  Get regular pelvic exams and Pap tests.  Take iron pills as told by your doctor.  Take over-the-counter and prescription medicines only as told by your doctor.  Keep all follow-up visits as told by your doctor. This is important. Contact a doctor if:  Your bleeding lasts for more than 1 week.  You have pain in your belly (abdomen).  You have bleeding during or after sex.  You have bleeding that happens more often than every 3 weeks. Get help right away if:  You have fever, chills, headache, dizziness, muscle aches, or bleeding.  You have very bad pain with bleeding.  You have clumps of blood (blood clots) coming from your vagina.  You have a lot of bleeding, and: ? You use more than 1 pad an hour. ? This kind of bleeding has never happened before.  You feel like you are going to pass out (faint). Summary  Any type of bleeding after menopause should be checked by your doctor.  Pay attention to any changes in your symptoms.  Keep all follow-up visits as told by your doctor. This information is not intended to replace advice given to you by your health care provider. Make sure you discuss any questions you have with your health care provider. Document Revised: 11/06/2018 Document Reviewed: 09/25/2016 Elsevier Patient Education  2020 Elsevier Inc.  

## 2020-08-22 NOTE — Telephone Encounter (Signed)
Patient called c/o heavy bleeding started on LMP:08/17/20 wearing pad changing every 1 hour. Reports last period over 2 years ago, cramping and clots. Reports she never started HRT prescribed with Elon Alas. Patient said is worried because she will be traveling to [facility] tomorrow via plane. I advised patient to schedule OV with provider, transferred to appointment desk.

## 2020-08-22 NOTE — Progress Notes (Signed)
   Acute Office Visit  Subjective:    Patient ID: [name], female    DOB: [birth year], 49 y.o.   MRN: [id number]   HPI 49 y.o. presents today for vaginal bleeding that started 5 days ago.  The bleeding is heavy with clots accompanied with dysmenorrhea and back pain.  LMP February 2020.  Not on HRT.  She is going to [place] tomorrow for the holidays and will be back at the beginning of the year.   Review of Systems  Constitutional: Negative.   Gastrointestinal: Positive for abdominal pain.  Genitourinary: Positive for menstrual problem and vaginal bleeding.  Musculoskeletal: Positive for back pain.       Objective:    Physical Exam Constitutional:      Appearance: Normal appearance.  Abdominal:     Tenderness: There is no abdominal tenderness.  Genitourinary:    General: Normal vulva.     Vagina: Normal.     Cervix: Normal.     Uterus: Normal.      BP 124/82   LMP 10/25/2018  Wt Readings from Last 3 Encounters:  10/19/19 148 lb (67.1 kg)  06/10/19 136 lb (61.7 kg)  05/28/19 136 lb (61.7 kg)        Assessment & Plan:   Problem List Items Addressed This Visit   None   Visit Diagnoses    Postmenopausal bleeding    -  Primary   Relevant Medications   megestrol (MEGACE) 40 MG tablet   Other Relevant Orders   Follicle stimulating hormone   TSH   US PELVIS TRANSVAGINAL NON-OB (TV ONLY)   Abnormal uterine bleeding (AUB)       Relevant Medications   megestrol (MEGACE) 40 MG tablet   Other Relevant Orders   US PELVIS TRANSVAGINAL NON-OB (TV ONLY)      Plan: We will schedule ultrasound for further evaluation of bleeding.  She will not be able to return for this until after the first of the year as she is traveling to [place] tomorrow.  Megace 40 mg twice a day for 10 days to stop bleeding.  FSH, TSH today.  She is agreeable to plan.    Chandler, 1:01 PM 08/22/2020

## 2020-09-13 ENCOUNTER — Ambulatory Visit: Payer: BC Managed Care – PPO | Admitting: Gastroenterology

## 2020-09-15 ENCOUNTER — Ambulatory Visit (INDEPENDENT_AMBULATORY_CARE_PROVIDER_SITE_OTHER): Payer: BC Managed Care – PPO | Admitting: Nurse Practitioner

## 2020-09-15 ENCOUNTER — Encounter: Payer: Self-pay | Admitting: Nurse Practitioner

## 2020-09-15 ENCOUNTER — Other Ambulatory Visit: Payer: Self-pay

## 2020-09-15 ENCOUNTER — Ambulatory Visit (INDEPENDENT_AMBULATORY_CARE_PROVIDER_SITE_OTHER): Payer: BC Managed Care – PPO

## 2020-09-15 VITALS — BP 130/80 | HR 96 | Resp 14 | Ht 60.0 in | Wt 158.5 lb

## 2020-09-15 DIAGNOSIS — D252 Subserosal leiomyoma of uterus: Secondary | ICD-10-CM

## 2020-09-15 DIAGNOSIS — N939 Abnormal uterine and vaginal bleeding, unspecified: Secondary | ICD-10-CM

## 2020-09-15 DIAGNOSIS — N950 Postmenopausal bleeding: Secondary | ICD-10-CM

## 2020-09-15 MED ORDER — MEGESTROL ACETATE 40 MG PO TABS: 40.0000 mg | ORAL_TABLET | Freq: Two times a day (BID) | ORAL | 1 refills | Status: DC

## 2020-09-15 NOTE — Patient Instructions (Signed)
Endometrial Biopsy  An endometrial biopsy is a procedure to remove tissue samples from the endometrium, which is the lining of the uterus. The tissue that is removed can then be checked under a microscope for disease. This procedure is used to diagnose conditions such as endometrial cancer, endometrial tuberculosis, polyps, or other inflammatory conditions. This procedure may also be used to investigate uterine bleeding to determine where you are in your menstrual cycle or how your hormone levels are affecting the lining of the uterus. Tell a health care provider about:  Any allergies you have.  All medicines you are taking, including vitamins, herbs, eye drops, creams, and over-the-counter medicines.  Any problems you or family members have had with anesthetic medicines.  Any blood disorders you have.  Any surgeries you have had.  Any medical conditions you have.  Whether you are pregnant or may be pregnant. What are the risks? Generally, this is a safe procedure. However, problems may occur, including:  Bleeding.  Pelvic infection.  Puncture of the wall of the uterus with the biopsy device (rare).  Allergic reactions to medicines. What happens before the procedure?  Keep a record of your menstrual cycles as told by your health care provider. You may need to schedule your procedure for a specific time in your cycle.  You may want to bring a sanitary pad to wear after the procedure.  Plan to have someone take you home from the hospital or clinic.  Ask your health care provider about: ? Changing or stopping your regular medicines. This is especially important if you are taking diabetes medicines, arthritis medicines, or blood thinners. ? Taking medicines such as aspirin and ibuprofen. These medicines can thin your blood. Do not take these medicines unless your health care provider tells you to take them. ? Taking over-the-counter medicines, vitamins, herbs, and  supplements. What happens during the procedure?  You will lie on an exam table with your feet and legs supported as in a pelvic exam.  Your health care provider will insert an instrument (speculum) into your vagina to see your cervix.  Your cervix will be cleansed with an antiseptic solution.  A medicine (local anesthetic) will be used to numb the cervix.  A forceps instrument (tenaculum) will be used to hold your cervix steady for the biopsy.  A thin, rod-like instrument (uterine sound) will be inserted through your cervix to determine the length of your uterus and the location where the biopsy sample will be removed.  A thin, flexible tube (catheter) will be inserted through your cervix and into the uterus. The catheter will be used to collect the biopsy sample from your endometrial tissue.  The catheter and speculum will then be removed, and the tissue sample will be sent to a lab for examination. The procedure may vary among health care providers and hospitals. What can I expect after procedure?  You will rest in a recovery area until you are ready to go home.  You may have mild cramping and a small amount of vaginal bleeding. This is normal.  You may have a small amount of vaginal bleeding for a few days. This is normal.  It is up to you to get the results of your procedure. Ask your health care provider, or the department that is doing the procedure, when your results will be ready. Follow these instructions at home:  Take over-the-counter and prescription medicines only as told by your health care provider.  Do not douche, use tampons, or have   sexual intercourse until your health care provider approves.  Return to your normal activities as told by your health care provider. Ask your health care provider what activities are safe for you.  Follow instructions from your health care provider about any activity restrictions, such as restrictions on strenuous exercise or heavy  lifting.  Keep all follow-up visits. This is important. Contact a health care provider:  You have heavy bleeding, or bleed for longer than 2 days after the procedure.  You have bad smelling discharge from your vagina.  You have a fever or chills.  You have a burning sensation when urinating or you have difficulty urinating.  You have severe pain in your lower abdomen. Get help right away if you:  You have severe cramps in your stomach or back.  You pass large blood clots.  Your bleeding increases.  You become weak or light-headed, or you faint or lose consciousness. Summary  An endometrial biopsy is a procedure to remove tissue samples is taken from the endometrium, which is the lining of the uterus.  The tissue sample that is removed will be checked under a microscope for disease.  This procedure is used to diagnose conditions such as endometrial cancer, endometrial tuberculosis, polyps, or other inflammatory conditions.  After the procedure, it is common to have mild cramping and a small amount of vaginal bleeding for a few days.  Do not douche, use tampons, or have sexual intercourse until your health care provider approves. Ask your health care provider which activities are safe for you. This information is not intended to replace advice given to you by your health care provider. Make sure you discuss any questions you have with your health care provider. Document Revised: 03/14/2020 Document Reviewed: 03/14/2020 Elsevier Patient Education  2021 Elsevier Inc.  

## 2020-09-15 NOTE — Progress Notes (Signed)
History: 50 year old presents to discuss ultrasound.  Was seen by me 08/22/2020 with complaints of vaginal bleeding that was heavy with clots and was accompanied by dysmenorrhea and back pain.  Prior to this, LMP February 2020.  [facility] 08/22/2020 was 17.9. She has been having off and on bleeding since last visit. Megace did slow bleeding some. Mother and sister had hysterectomies for heavy bleeding. Most recent ultrasound March 2016 showed single fundal fibroid 23 x 21 x 27 mm.   Exam: Appears well Ultrasound: Anteverted uterus - distorted to left pelvis. Enlarged. Anterior right fibroid 66 x 60 mm, subserosal. Endometrial thickness 7.7 mm, avascular, difficult visualization due to shadowing of fibroid.  No masses seen.  Both ovaries normal size. No adnexal masses, no free fluid.  Assessment: Subserous leiomyoma of uterus Abnormal uterine bleeding  Plan: Discussed ultrasound and finding of large uterine fibroid that has tripled in size since last ultrasound. Plan of care discussed with Dr. Dellis Filbert. We will schedule endometrial biopsy and discussion of possible surgery with her. Information provided on endometrial biopsy procedure and what to expect. Megace refilled. She is agreeable to plan.

## 2020-09-21 ENCOUNTER — Ambulatory Visit: Payer: BC Managed Care – PPO | Admitting: Obstetrics & Gynecology

## 2020-09-22 ENCOUNTER — Other Ambulatory Visit: Payer: Self-pay

## 2020-09-22 ENCOUNTER — Ambulatory Visit: Payer: BC Managed Care – PPO | Admitting: Obstetrics & Gynecology

## 2020-09-22 ENCOUNTER — Encounter: Payer: Self-pay | Admitting: Obstetrics & Gynecology

## 2020-09-22 VITALS — BP 122/80

## 2020-09-22 DIAGNOSIS — R9389 Abnormal findings on diagnostic imaging of other specified body structures: Secondary | ICD-10-CM

## 2020-09-22 DIAGNOSIS — D252 Subserosal leiomyoma of uterus: Secondary | ICD-10-CM | POA: Diagnosis not present

## 2020-09-22 DIAGNOSIS — N939 Abnormal uterine and vaginal bleeding, unspecified: Secondary | ICD-10-CM | POA: Diagnosis not present

## 2020-09-22 MED ORDER — NORETHINDRONE 0.35 MG PO TABS: 1.0000 | ORAL_TABLET | Freq: Every day | ORAL | 4 refills | Status: DC

## 2020-09-22 NOTE — Addendum Note (Signed)
Addended by: Princess Bruins on: 09/22/2020 12:35 PM   Modules accepted: Orders

## 2020-09-22 NOTE — Progress Notes (Signed)
° ° °  [name] August 23, 1971 [id number]        50 y.o.  [id number]   RP: Menometrorrhagia with thickened Endometrium for EBx  HPI: Menometrorrhagia controled on Megace.  Pelvic US showed a SS Fibroid at 6.6 cm with the endometrial lining at 7.7 mm.   OB History  Gravida Para Term Preterm AB Living  3 2 2   1 2   SAB IAB Ectopic Multiple Live Births  1            # Outcome Date GA Lbr Len/2nd Weight Sex Delivery Anes PTL Lv  3 SAB           2 Term           1 Term             Past medical history,surgical history, problem list, medications, allergies, family history and social history were all reviewed and documented in the EPIC chart.   Directed ROS with pertinent positives and negatives documented in the history of present illness/assessment and plan.  Exam:  Vitals:   09/22/20 1159  BP: 122/80   General appearance:  Normal  Verbal consent obtained for EBx.  EBx procedure:  Vulva normal/Vagina normal/Cervix normal.  Betadine prep.  Hurricane spray.  Tenaculum on anterior lip of the cervix.  Mild dilation with Os Finder.  Easy Endometrial Bx with the suction canula.  Moderate specimen sent to pathology.  Well tolerated with a uterine cramp.  No Cx.   Assessment/Plan:  50 y.o. [id number]   1. Thickened endometrium Endometrial Bx performed without difficulty or Cx after verbal consent.  Management per Patho results.  Post procedure precautions reviewed.  2. Abnormal uterine bleeding (AUB) As above  3. Subserous leiomyoma of uterus Reassured about the Fibroid in SS location.  Princess Bruins MD, 12:35 PM 09/22/2020

## 2020-09-28 ENCOUNTER — Encounter: Payer: Self-pay | Admitting: Obstetrics & Gynecology

## 2020-09-28 LAB — TISSUE SPECIMEN

## 2020-09-28 LAB — PATHOLOGY REPORT

## 2020-10-12 ENCOUNTER — Encounter: Payer: Self-pay | Admitting: *Deleted

## 2020-10-12 ENCOUNTER — Other Ambulatory Visit: Payer: Self-pay

## 2020-10-12 ENCOUNTER — Telehealth: Payer: Self-pay

## 2020-10-12 ENCOUNTER — Encounter: Payer: Self-pay | Admitting: Obstetrics & Gynecology

## 2020-10-12 ENCOUNTER — Ambulatory Visit: Payer: BC Managed Care – PPO | Admitting: Obstetrics & Gynecology

## 2020-10-12 VITALS — BP 138/90 | HR 80 | Ht 60.0 in | Wt 158.0 lb

## 2020-10-12 DIAGNOSIS — N921 Excessive and frequent menstruation with irregular cycle: Secondary | ICD-10-CM | POA: Diagnosis not present

## 2020-10-12 DIAGNOSIS — D252 Subserosal leiomyoma of uterus: Secondary | ICD-10-CM | POA: Diagnosis not present

## 2020-10-12 DIAGNOSIS — N946 Dysmenorrhea, unspecified: Secondary | ICD-10-CM | POA: Diagnosis not present

## 2020-10-12 LAB — CBC
HCT: 39.7 % (ref 35.0–45.0)
Hemoglobin: 13 g/dL (ref 11.7–15.5)
MCH: 27.1 pg (ref 27.0–33.0)
MCHC: 32.7 g/dL (ref 32.0–36.0)
MCV: 82.9 fL (ref 80.0–100.0)
MPV: 11.6 fL (ref 7.5–12.5)
Platelets: 278 10*3/uL (ref 140–400)
RBC: 4.79 10*6/uL (ref 3.80–5.10)
RDW: 15.3 % — ABNORMAL HIGH (ref 11.0–15.0)
WBC: 10.9 10*3/uL — ABNORMAL HIGH (ref 3.8–10.8)

## 2020-10-12 MED ORDER — MEGESTROL ACETATE 40 MG PO TABS: 40.0000 mg | ORAL_TABLET | Freq: Two times a day (BID) | ORAL | 1 refills | Status: DC

## 2020-10-12 NOTE — Progress Notes (Signed)
    [name] 29-Mar-1971 [id number]        49 y.o.  [id number]   RP: Heavy menses with clots  HPI: Menometrorrhagia previously controled on Megace, now bleeding heavily again.  Pelvic US showed a thickened endometrial line at 7.7 mm with a SS Fibroid at 6.6 cm. The EBx on 09/26/20 was benign.     OB History  Gravida Para Term Preterm AB Living  3 2 2   1 2   SAB IAB Ectopic Multiple Live Births  1            # Outcome Date GA Lbr Len/2nd Weight Sex Delivery Anes PTL Lv  3 SAB           2 Term           1 Term             Past medical history,surgical history, problem list, medications, allergies, family history and social history were all reviewed and documented in the EPIC chart.   Directed ROS with pertinent positives and negatives documented in the history of present illness/assessment and plan.  Exam:  Vitals:   10/12/20 1547  BP: 138/90  Pulse: 80  Weight: 158 lb (71.7 kg)  Height: 5' (1.524 m)   General appearance:  Normal  Abdomen: Normal  Gynecologic exam: Vulva normal.  Speculum:  Cervix/Vagina normal.  Moderate dark blood from the Exo-Cervix.  EBx 09/26/2020:  Endometrium with features of exogenous hormone  therapy effect and focal breakdown.  No hyperplasia, atypia, or malignancy.    Assessment/Plan:  50 y.o. [id number]   1. Menometrorrhagia Recurrence of heavy menometrorrhagia with severe persistent and worsening dysmenorrhea.  Suspicion of adenomyosis.  Patient declines further medical/hormonal treatment.  Decision to proceed with hysterectomy.  Approaches reviewed.  Decision to proceed with XI Robotic TLH/Bilateral Salpingectomy.  Benefits of preserving the ovaries discussed.  Patient understands and agrees with the plan.  We will check her hemoglobin today with a CBC to rule out anemia.  Follow-up preop visit. - CBC  2. Severe dysmenorrhea Persistent worsening severe dysmenorrhea.  Suspicion of adenomyosis.  3. Subserous leiomyoma of uterus As  above.  Other orders - megestrol (MEGACE) 40 MG tablet; Take 1 tablet (40 mg total) by mouth 2 (two) times daily.  Princess Bruins MD, 4:45 PM 10/12/2020

## 2020-10-12 NOTE — Telephone Encounter (Signed)
I was asked by Butch Penny to pick up and speak with patient. Patient had called to see if she could be seen now as she is bleeding and in much pain.  Patient said she is bleeding heavy and has saturated 3 pads today. She said she is in so much pain she feels numb from the waist down. She would not be able to drive herself. I advised her to come to the office now and we will be working her in to be seen. Butch Penny informed.

## 2020-10-13 ENCOUNTER — Ambulatory Visit: Payer: BC Managed Care – PPO | Admitting: Obstetrics & Gynecology

## 2020-10-13 ENCOUNTER — Telehealth: Payer: Self-pay

## 2020-10-13 ENCOUNTER — Ambulatory Visit: Payer: BC Managed Care – PPO | Admitting: Nurse Practitioner

## 2020-10-13 NOTE — Telephone Encounter (Signed)
-----   Message from Ramond Craver, [place] sent at 10/12/2020  5:15 PM EST ----- Regarding: FW: Schedule surgery N92.1, N94.6, D25.2    /  [id number]    Thanks! ----- Message ----- From: Princess Bruins, MD Sent: 10/12/2020   4:48 PM EST To: Ramond Craver, RMA Subject: Schedule surgery                               Menometrorrhagia/Dysmenorrhea/Fibroid  XI Robotic TLH/Bilateral Salpingectomy  [facility]  2 hours  Preop with me

## 2020-10-16 ENCOUNTER — Encounter: Payer: Self-pay | Admitting: Obstetrics & Gynecology

## 2020-10-18 ENCOUNTER — Telehealth: Payer: Self-pay

## 2020-10-18 NOTE — Telephone Encounter (Signed)
I called patient to discuss scheduling surgery.  We discussed available date 11/22/20 and the need for Covid test and quarantine protocol.  Patient needs to be in court on 11/17/20 with her son so I will scheduler her Covid test for 11/18/20.  She said the Megace has completely stopped her bleeding and she thought maybe she did not need to have surgery but I did advise her that is a short term treatment to suppress bleeding and allow her Hemoglobin to recover while she arranges surgery.  She is going to check with family member who is traveling here to assist her during surgery/recovery to confirm this date works. She will call me back asap to let me know. (I have scheduled the surgery to hold the time in the OR.)

## 2020-10-18 NOTE — Telephone Encounter (Signed)
The Megestrol 40 mg bid has stopped her bleeding. We are discussing scheduling her surgery in March and she asked if she needed to wait until June or July to schedule if you would keep her on the Megace until then so she would not have to bleed?

## 2020-10-22 NOTE — Telephone Encounter (Signed)
Yes, can continue on Megace until surgery in June-July 2022.

## 2020-10-24 NOTE — Telephone Encounter (Signed)
I spoke with patient today and she confirms she would like to proceed with surgery on 11/22/20 at 11:45am at [facility].  I scheduled her Covid test and advised her of quarantine protocol following testing.  Handout and site map will be mailed.  Patient wanted me to let Dr. Marguerita Merles know that when she had hernia repair that mesh was placed internally across her abdomen. I spoke with Dr. Dellis Filbert about this and she said to note it on her pre op visit so that she could check it. Patient was informed.

## 2020-10-24 NOTE — Telephone Encounter (Signed)
Patient informed. 

## 2020-10-25 NOTE — Telephone Encounter (Signed)
Patient called back and I informed her of need to change surgery plan because of the size of the mesh.  Surgery will be cancelled and she was scheduled to come and talk with Dr. Dellis Filbert about the new plan for surgery.

## 2020-10-25 NOTE — Telephone Encounter (Signed)
I printed the op report from patient's hernia surgery for Dr. Dellis Filbert to review.  Dr. Marguerita Merles wrote "given the large size of the mesh, recommend to change plan. Schedule visit with me. Recommend Sonata with Novasure ablation."   I called patient and left message to call me.

## 2020-11-01 ENCOUNTER — Ambulatory Visit: Payer: BC Managed Care – PPO | Admitting: Obstetrics & Gynecology

## 2020-11-04 ENCOUNTER — Ambulatory Visit: Payer: BC Managed Care – PPO | Admitting: Obstetrics & Gynecology

## 2020-11-04 ENCOUNTER — Other Ambulatory Visit: Payer: Self-pay | Admitting: Obstetrics & Gynecology

## 2020-11-04 ENCOUNTER — Other Ambulatory Visit: Payer: Self-pay

## 2020-11-04 ENCOUNTER — Encounter: Payer: Self-pay | Admitting: Obstetrics & Gynecology

## 2020-11-04 VITALS — BP 130/84

## 2020-11-04 DIAGNOSIS — K429 Umbilical hernia without obstruction or gangrene: Secondary | ICD-10-CM

## 2020-11-04 DIAGNOSIS — N921 Excessive and frequent menstruation with irregular cycle: Secondary | ICD-10-CM

## 2020-11-04 DIAGNOSIS — D252 Subserosal leiomyoma of uterus: Secondary | ICD-10-CM | POA: Diagnosis not present

## 2020-11-04 DIAGNOSIS — N946 Dysmenorrhea, unspecified: Secondary | ICD-10-CM | POA: Diagnosis not present

## 2020-11-04 NOTE — Progress Notes (Signed)
    [name] [birth year] [id number]        50 y.o.  [id number]   RP: Menorrhagia/Dysmenorrhea/Fibroid for counseling on surgery  HPI: Patient had a large umbilical hernia repaired with a large mesh.  Heavy menses currently controled on Megace.   OB History  Gravida Para Term Preterm AB Living  3 2 2   1 2   SAB IAB Ectopic Multiple Live Births  1            # Outcome Date GA Lbr Len/2nd Weight Sex Delivery Anes PTL Lv  3 SAB           2 Term           1 Term             Past medical history,surgical history, problem list, medications, allergies, family history and social history were all reviewed and documented in the EPIC chart.   Directed ROS with pertinent positives and negatives documented in the history of present illness/assessment and plan.  Exam:  Vitals:   11/04/20 0937  BP: 130/84   General appearance:  Normal  Abdomen: Large mesh for abdominal hernia repair  Gynecologic exam: Deferred  Hernia repair with large abdominal mesh.  Operative report obtained and read.  Pelvic US 09/15/2020: Anteverted uterus - distorted to left pelvis Enlarged. Anterior right fibroid 66 x 60 mm, subserosal and IM Endometrial thickness 7.7 mm, avascular, difficult visualization due to shadowing of fibroid No masses seen Both ovaries normal size No adnexal masses No free fluid   Assessment/Plan:  50 y.o. [id number]   1. Subserous and IM leiomyoma of uterus Large 6.6 cm Fundal IM and SS Myoma with menorrhagia and dysmenorrhea.  Because of the presence of a very large abdominal mesh used to repair an umbilical/ventral hernia, decision not to proceed with an XI Robotic TLH/Bilateral Salpingectomy.  Patient is a very good candidate for the Sonato procedure.  Will proceed with HSC/D+C/Sonata procedure/Novasure Endometrial Ablation. Procedures/risks/benefits reviewed.  Info and pamphlet given.  Scheduling.  2. Menometrorrhagia Currently improved on Megace.  Continue until  surgery.  3. Severe dysmenorrhea As above.  4. Umbilical hernia without obstruction and without gangrene Large mesh for repair taking the entire abdomen around the umbilicus.  Princess Bruins MD, 9:53 AM 11/04/2020

## 2020-11-08 ENCOUNTER — Telehealth: Payer: Self-pay

## 2020-11-08 NOTE — Telephone Encounter (Signed)
I called and spoke with patient about needing medical records release form signed for Sonata rep to be able to check if PA needed and to handle PA if required.  Patient requested I email it to her and she will complete and fax it back to me. Fax # provided at her request.

## 2020-11-14 ENCOUNTER — Telehealth: Payer: Self-pay

## 2020-11-14 NOTE — Telephone Encounter (Signed)
Spoke with patient regarding surgery benefits. Patient acknowledges understanding of information presented.  Informed patient I would call back after speaking with [name] regarding surgery benefits.

## 2020-11-15 ENCOUNTER — Telehealth: Payer: Self-pay

## 2020-11-15 NOTE — Telephone Encounter (Signed)
Patient called because she received letter from [facility] That her request for Sonata procedure has been denied.  I reassured her and told her that a little further down in the letter is states "We are reviewing your case and preparing your first level of appeal".   We will wait to hear once all appeals have been performed.

## 2020-11-18 ENCOUNTER — Other Ambulatory Visit (HOSPITAL_COMMUNITY): Payer: Self-pay

## 2020-11-22 ENCOUNTER — Ambulatory Visit (HOSPITAL_BASED_OUTPATIENT_CLINIC_OR_DEPARTMENT_OTHER): Admit: 2020-11-22 | Payer: Self-pay | Admitting: Obstetrics & Gynecology

## 2020-11-22 ENCOUNTER — Encounter (HOSPITAL_BASED_OUTPATIENT_CLINIC_OR_DEPARTMENT_OTHER): Payer: Self-pay

## 2020-11-22 SURGERY — XI ROBOTIC ASSISTED LAPAROSCOPIC HYSTERECTOMY AND SALPINGECTOMY
Anesthesia: General | Laterality: Bilateral

## 2020-12-06 ENCOUNTER — Ambulatory Visit: Payer: BC Managed Care – PPO | Admitting: Obstetrics & Gynecology

## 2020-12-06 ENCOUNTER — Telehealth: Payer: Self-pay

## 2020-12-06 ENCOUNTER — Encounter: Payer: Self-pay | Admitting: Obstetrics & Gynecology

## 2020-12-06 ENCOUNTER — Other Ambulatory Visit: Payer: Self-pay

## 2020-12-06 VITALS — BP 140/86

## 2020-12-06 DIAGNOSIS — N921 Excessive and frequent menstruation with irregular cycle: Secondary | ICD-10-CM

## 2020-12-06 DIAGNOSIS — D252 Subserosal leiomyoma of uterus: Secondary | ICD-10-CM

## 2020-12-06 DIAGNOSIS — N946 Dysmenorrhea, unspecified: Secondary | ICD-10-CM

## 2020-12-06 LAB — CBC
HCT: 37.9 % (ref 35.0–45.0)
Hemoglobin: 12.6 g/dL (ref 11.7–15.5)
MCH: 28 pg (ref 27.0–33.0)
MCHC: 33.2 g/dL (ref 32.0–36.0)
MCV: 84.2 fL (ref 80.0–100.0)
MPV: 12.5 fL (ref 7.5–12.5)
Platelets: 249 10*3/uL (ref 140–400)
RBC: 4.5 10*6/uL (ref 3.80–5.10)
RDW: 15.1 % — ABNORMAL HIGH (ref 11.0–15.0)
WBC: 8.6 10*3/uL (ref 3.8–10.8)

## 2020-12-06 NOTE — Telephone Encounter (Signed)
Patient said she is taking Megace 40mg  bid and still bleeding very heavy. Sees most of it as blood clots when she goes to the toilet but bleeds onto pad as well.  Experiencing pain/crampiness with clots.  She has missed work several times in the last few weeks because of the pain and bleeding. She said this has become life altering and she is struggling and needs help.  She is waiting on a first level appeal with [employer] and they are waiting on her insurance company.   She is asking is there anything else you can do that we can go ahead and do and get this bleeding stopped. She does not want to wait any longer on insurance company to approve Sonata.  She made appt for 4pm today to talk with you about all of this but hopes to avoid missing work to come in if you can address this here for her.

## 2020-12-06 NOTE — Telephone Encounter (Signed)
I called patient and let her know Dr. Marguerita Merles in later from surgery and has been seeing patients and has not reviewed telephone encounter at this time. Patient decided she will keep 4pm appointment and talk with Dr. Marguerita Merles at that time.

## 2020-12-06 NOTE — Progress Notes (Signed)
    Andrea IPOCK July 29, 1971 [id number]        50 y.o.  [id number]   RP: Continued menometrorrhagia on Megace  HPI: Continues to bleed on Megace, but overall less abundant.  Pelvic discomfort with cramps.  Known fibroids.   OB History  Gravida Para Term Preterm AB Living  3 2 2   1 2   SAB IAB Ectopic Multiple Live Births  1            # Outcome Date GA Lbr Len/2nd Weight Sex Delivery Anes PTL Lv  3 SAB           2 Term           1 Term             Past medical history,surgical history, problem list, medications, allergies, family history and social history were all reviewed and documented in the EPIC chart.   Directed ROS with pertinent positives and negatives documented in the history of present illness/assessment and plan.  Exam:  Vitals:   12/06/20 1601  BP: 140/86   General appearance:  Normal  Abdomen:  Large mesh for hernia repair.  Gynecologic exam: Vulva normal.  Bimanual exam:  Uterus AV, mobile, NT.  No adnexal mass, NT.  No bleeding.   Assessment/Plan:  50 y.o. [id number]   1. Menometrorrhagia Menometrorrhagia on Megace.  No current active bleeding on exam today.  CBC drawn.  Scheduled for HSC/D+C with [facility] procedure.  Add Novasure Endometrial Ablation.  Continue on Megace until surgery.  Procedures, risks/benefits reviewed with patient. - CBC  2. Severe dysmenorrhea As above.  3. Subserous leiomyoma of uterus As above.  Princess Bruins MD, 4:24 PM 12/06/2020

## 2020-12-13 ENCOUNTER — Encounter: Payer: Self-pay | Admitting: Obstetrics & Gynecology

## 2020-12-28 ENCOUNTER — Other Ambulatory Visit: Payer: Self-pay | Admitting: Obstetrics & Gynecology

## 2020-12-30 ENCOUNTER — Other Ambulatory Visit: Payer: Self-pay

## 2020-12-30 ENCOUNTER — Emergency Department (HOSPITAL_BASED_OUTPATIENT_CLINIC_OR_DEPARTMENT_OTHER): Payer: BC Managed Care – PPO | Admitting: Radiology

## 2020-12-30 ENCOUNTER — Emergency Department (HOSPITAL_BASED_OUTPATIENT_CLINIC_OR_DEPARTMENT_OTHER)
Admission: EM | Admit: 2020-12-30 | Discharge: 2020-12-30 | Disposition: A | Discharge status: Home / Self Care | Payer: BC Managed Care – PPO | Attending: Emergency Medicine | Admitting: Emergency Medicine

## 2020-12-30 ENCOUNTER — Encounter (HOSPITAL_BASED_OUTPATIENT_CLINIC_OR_DEPARTMENT_OTHER): Payer: Self-pay

## 2020-12-30 ENCOUNTER — Emergency Department (HOSPITAL_BASED_OUTPATIENT_CLINIC_OR_DEPARTMENT_OTHER): Payer: BC Managed Care – PPO

## 2020-12-30 DIAGNOSIS — R0602 Shortness of breath: Secondary | ICD-10-CM | POA: Insufficient documentation

## 2020-12-30 DIAGNOSIS — R0600 Dyspnea, unspecified: Secondary | ICD-10-CM

## 2020-12-30 DIAGNOSIS — R7989 Other specified abnormal findings of blood chemistry: Secondary | ICD-10-CM | POA: Insufficient documentation

## 2020-12-30 DIAGNOSIS — R0981 Nasal congestion: Secondary | ICD-10-CM | POA: Insufficient documentation

## 2020-12-30 DIAGNOSIS — R059 Cough, unspecified: Secondary | ICD-10-CM | POA: Insufficient documentation

## 2020-12-30 DIAGNOSIS — R0789 Other chest pain: Secondary | ICD-10-CM | POA: Diagnosis not present

## 2020-12-30 DIAGNOSIS — R0609 Other forms of dyspnea: Secondary | ICD-10-CM

## 2020-12-30 LAB — CBC WITH DIFFERENTIAL/PLATELET
Abs Immature Granulocytes: 0.02 10*3/uL (ref 0.00–0.07)
Basophils Absolute: 0 10*3/uL (ref 0.0–0.1)
Basophils Relative: 1 %
Eosinophils Absolute: 0.2 10*3/uL (ref 0.0–0.5)
Eosinophils Relative: 2 %
HCT: 39.6 % (ref 36.0–46.0)
Hemoglobin: 13.1 g/dL (ref 12.0–15.0)
Immature Granulocytes: 0 %
Lymphocytes Relative: 40 %
Lymphs Abs: 3.3 10*3/uL (ref 0.7–4.0)
MCH: 27.9 pg (ref 26.0–34.0)
MCHC: 33.1 g/dL (ref 30.0–36.0)
MCV: 84.4 fL (ref 80.0–100.0)
Monocytes Absolute: 0.6 10*3/uL (ref 0.1–1.0)
Monocytes Relative: 7 %
Neutro Abs: 4.3 10*3/uL (ref 1.7–7.7)
Neutrophils Relative %: 50 %
Platelets: 278 10*3/uL (ref 150–400)
RBC: 4.69 MIL/uL (ref 3.87–5.11)
RDW: 16.5 % — ABNORMAL HIGH (ref 11.5–15.5)
WBC: 8.4 10*3/uL (ref 4.0–10.5)
nRBC: 0 % (ref 0.0–0.2)

## 2020-12-30 LAB — COMPREHENSIVE METABOLIC PANEL
ALT: 258 U/L — ABNORMAL HIGH (ref 0–44)
AST: 112 U/L — ABNORMAL HIGH (ref 15–41)
Albumin: 4.7 g/dL (ref 3.5–5.0)
Alkaline Phosphatase: 50 U/L (ref 38–126)
Anion gap: 9 (ref 5–15)
BUN: 10 mg/dL (ref 6–20)
CO2: 24 mmol/L (ref 22–32)
Calcium: 9.9 mg/dL (ref 8.9–10.3)
Chloride: 105 mmol/L (ref 98–111)
Creatinine, Ser: 0.7 mg/dL (ref 0.44–1.00)
GFR, Estimated: >60 mL/min (ref >60)
Glucose, Bld: 105 mg/dL — ABNORMAL HIGH (ref 70–99)
Potassium: 3.7 mmol/L (ref 3.5–5.1)
Sodium: 138 mmol/L (ref 135–145)
Total Bilirubin: 0.5 mg/dL (ref 0.3–1.2)
Total Protein: 7.7 g/dL (ref 6.5–8.1)

## 2020-12-30 LAB — TROPONIN I (HIGH SENSITIVITY)
Troponin I (High Sensitivity): 3 ng/L (ref <18)
Troponin I (High Sensitivity): 3 ng/L (ref <18)
Troponin I (High Sensitivity): 3 ng/L (ref <18)

## 2020-12-30 LAB — D-DIMER, QUANTITATIVE: D-Dimer, Quant: 0.53 ug/mL-FEU — ABNORMAL HIGH (ref 0.00–0.50)

## 2020-12-30 LAB — PREGNANCY, URINE: Preg Test, Ur: NEGATIVE

## 2020-12-30 MED ORDER — IOHEXOL 350 MG/ML SOLN
100.0000 mL | Freq: Once | INTRAVENOUS | Status: AC | PRN
  Administered 2020-12-30: 100 mL via INTRAVENOUS

## 2020-12-30 NOTE — ED Notes (Signed)
2 unsuccessful IV attempts.

## 2020-12-30 NOTE — ED Triage Notes (Signed)
Intermittent SOB with associated CP x "weeks" per pt that is mostly relieved by rest.  Denies any N/V/D or fevers.

## 2020-12-30 NOTE — ED Provider Notes (Signed)
[facility] EMERGENCY DEPT Provider Note   CSN: [id number] Arrival date & time: 12/30/20  8546     History Chief Complaint  Patient presents with  . Shortness of Breath    [name] is a 50 y.o. female.  50 year old female who presents with shortness of breath and chest pain.  For the past 3 weeks, patient has had intermittent episodes of shortness of breath that she states is worse with exertion and improves with rest.  She reports occasional palpitations with the shortness of breath.  She also reports intermittent chest pain that radiates down her right arm which she describes as a pressure.  The pain comes and goes randomly and is not necessarily associated with the shortness of breath episodes.  She reports occasional cough and congestion which she attributes to seasonal allergies.  No fevers or recent illness.  No associated nausea or vomiting.  She denies any leg swelling or pain.  No recent travel, history of blood clots, history of cancer, tobacco use, or alcohol/drug problems.  Family history notable for mother with CAD.  The history is provided by the patient.  Shortness of Breath      Past Medical History:  Diagnosis Date  . Complication of anesthesia    Per pt,"hard to wake up past sedation".  . Post-operative nausea and vomiting     Patient Active Problem List   Diagnosis Date Noted  . Umbilical hernia 27/11/5007  . Diastasis recti 06/02/2012  . REFLUX ESOPHAGITIS 11/17/2007  . GASTROESOPHAGEAL REFLUX DISEASE 11/17/2007  . DYSPHAGIA UNSPECIFIED 11/17/2007    Past Surgical History:  Procedure Laterality Date  . CESAREAN SECTION     2 times  . LAPAROSCOPIC ASSISTED VENTRAL HERNIA REPAIR N/A 10/11/2014   Procedure: LAPAROSCOPIC ASSISTED VENTRAL WALL HERNIA REPAIR;  Surgeon: Michael Boston, MD;  Location: [facility]S;  Service: General;  Laterality: N/A;  . UMBILICAL HERNIA REPAIR N/A 10/11/2014   Procedure: HERNIA REPAIR UMBILICAL ADULT;  Surgeon:  Michael Boston, MD;  Location: [facility]S;  Service: General;  Laterality: N/A;  with MESH     OB History    Gravida  3   Para  2   Term  2   Preterm      AB  1   Living  2     SAB  1   IAB      Ectopic      Multiple      Live Births              Family History  Problem Relation Age of Onset  . COPD Mother   . Hypertension Mother   . Hypertension Father   . Rectal cancer Father   . Breast cancer Maternal Aunt        after menopause  . Cancer Maternal Aunt        breast  . Cancer Maternal Grandmother        ovarian or cervical  . Hypertension Brother   . Breast cancer Cousin   . Breast cancer Maternal Aunt   . Stomach cancer Neg Hx   . Esophageal cancer Neg Hx     Social History   Tobacco Use  . Smoking status: Never Smoker  . Smokeless tobacco: Never Used  Vaping Use  . Vaping Use: Never used  Substance Use Topics  . Alcohol use: Not Currently    Comment: rare/ socially  . Drug use: No    Home Medications Prior to Admission medications  Medication Sig Start Date End Date Taking? Authorizing Provider  fluticasone (FLONASE) 50 MCG/ACT nasal spray 1 spray in each nostril    [provider]  ibuprofen (ADVIL,MOTRIN) 600 MG tablet Take 1 tablet (600 mg total) by mouth every 6 (six) hours as needed for mild pain or moderate pain. 10/11/14   Michael Boston, MD  megestrol (MEGACE) 40 MG tablet TAKE 1 TABLET BY MOUTH TWICE A DAY 12/30/20   Princess Bruins, MD  Multiple Vitamin (MULTIVITAMIN WITH MINERALS) TABS tablet Take 1 tablet by mouth daily.    [provider]    Allergies    Oxycodone-acetaminophen, Percocet [oxycodone-acetaminophen], and Flexeril [cyclobenzaprine]  Review of Systems   Review of Systems  Respiratory: Positive for shortness of breath.    All other systems reviewed and are negative except that which was mentioned in HPI  Physical Exam Updated Vital Signs BP (!) 143/92   Pulse 86   Temp 98.3 F (36.8 C)  (Oral)   Resp (!) 21   Ht 5' (1.524 m)   Wt 68 kg   SpO2 100%   BMI 29.29 kg/m   Physical Exam Vitals and nursing note reviewed.  Constitutional:      General: She is not in acute distress.    Appearance: Normal appearance. She is well-developed. She is obese.  HENT:     Head: Normocephalic and atraumatic.  Eyes:     Conjunctiva/sclera: Conjunctivae normal.  Cardiovascular:     Rate and Rhythm: Normal rate and regular rhythm.     Heart sounds: Normal heart sounds. No murmur heard.   Pulmonary:     Effort: Pulmonary effort is normal.     Breath sounds: Normal breath sounds.  Abdominal:     General: Abdomen is flat. Bowel sounds are normal. There is no distension.     Palpations: Abdomen is soft.     Tenderness: There is no abdominal tenderness.  Musculoskeletal:     Right lower leg: No edema.     Left lower leg: No edema.  Skin:    General: Skin is warm and dry.  Neurological:     Mental Status: She is alert and oriented to person, place, and time.     Comments: fluent  Psychiatric:        Mood and Affect: Mood normal.        Behavior: Behavior normal.     ED Results / Procedures / Treatments   Labs (all labs ordered are listed, but only abnormal results are displayed) Labs Reviewed  COMPREHENSIVE METABOLIC PANEL - Abnormal; Notable for the following components:      Result Value   Glucose, Bld 105 (*)    AST 112 (*)    ALT 258 (*)    All other components within normal limits  CBC WITH DIFFERENTIAL/PLATELET - Abnormal; Notable for the following components:   RDW 16.5 (*)    All other components within normal limits  D-DIMER, QUANTITATIVE - Abnormal; Notable for the following components:   D-Dimer, Quant 0.53 (*)    All other components within normal limits  PREGNANCY, URINE  TROPONIN I (HIGH SENSITIVITY)  TROPONIN I (HIGH SENSITIVITY)  TROPONIN I (HIGH SENSITIVITY)    EKG EKG Interpretation  Date/Time:  Friday December 30 2020 09:34:30 EDT Ventricular  Rate:  96 PR Interval:  173 QRS Duration: 66 QT Interval:  356 QTC Calculation: 450 R Axis:   65 Text Interpretation: Sinus rhythm Left atrial enlargement Probable anterior infarct, age indeterminate rate faster  otherwise similar to previous Confirmed by Theotis Burrow [phone]) on 12/30/2020 9:46:01 AM   Radiology DG Chest 2 View  Result Date: 12/30/2020 CLINICAL DATA:  50 year old female with chest pain and shortness of breath for several weeks. EXAM: CHEST - 2 VIEW COMPARISON:  Chest radiographs 12/29/2013. FINDINGS: Lung volumes and mediastinal contours are stable and within normal limits. Visualized tracheal air column is within normal limits. Both lungs appear clear. No pneumothorax or pleural effusion. Negative for age visible osseous structures. Negative visible bowel gas pattern. IMPRESSION: Negative.  No cardiopulmonary abnormality. Electronically Signed   By: Genevie Ann M.D.   On: 12/30/2020 10:53   CT Angio Chest PE W/Cm &/Or Wo Cm  Result Date: 12/30/2020 CLINICAL DATA:  Shortness of breath and epigastric pain for the past few weeks. EXAM: CT ANGIOGRAPHY CHEST CT ABDOMEN AND PELVIS WITH CONTRAST TECHNIQUE: Multidetector CT imaging of the chest was performed using the standard protocol during bolus administration of intravenous contrast. Multiplanar CT image reconstructions and MIPs were obtained to evaluate the vascular anatomy. Multidetector CT imaging of the abdomen and pelvis was performed using the standard protocol during bolus administration of intravenous contrast. CONTRAST:  172mL OMNIPAQUE IOHEXOL 350 MG/ML SOLN COMPARISON:  Chest x-ray from same day. CT abdomen pelvis dated August 18, 2014. FINDINGS: CTA CHEST FINDINGS Cardiovascular: Satisfactory opacification of the pulmonary arteries to the segmental level. No evidence of pulmonary embolism. Normal heart size. No pericardial effusion. No thoracic aortic aneurysm or dissection. Mediastinum/Nodes: No enlarged mediastinal, hilar,  or axillary lymph nodes. Thyroid gland, trachea, and esophagus demonstrate no significant findings. Lungs/Pleura: Lungs are clear. No pleural effusion or pneumothorax. Musculoskeletal: No chest wall abnormality. No acute or significant osseous findings. Review of the MIP images confirms the above findings. CT ABDOMEN AND PELVIS FINDINGS Hepatobiliary: No focal liver abnormality is seen. No gallstones, gallbladder wall thickening, or biliary dilatation. Pancreas: Unremarkable. No pancreatic ductal dilatation or surrounding inflammatory changes. Spleen: Normal in size without focal abnormality. Adrenals/Urinary Tract: Adrenal glands are unremarkable. Kidneys are normal, without renal calculi, focal lesion, or hydronephrosis. Bladder is unremarkable. Stomach/Bowel: Stomach is within normal limits. Appendix appears normal. No evidence of bowel wall thickening, distention, or inflammatory changes. Vascular/Lymphatic: No significant vascular findings are present. No enlarged abdominal or pelvic lymph nodes. Reproductive: 5.4 cm fundal fibroid with submucosal component, previously 2.8 cm in 2015. No adnexal mass. Other: No abdominal wall hernia or abnormality. No abdominopelvic ascites. No pneumoperitoneum. Musculoskeletal: No acute or significant osseous findings. Review of the MIP images confirms the above findings. IMPRESSION: 1. No evidence of pulmonary embolism. No acute intrathoracic process. 2. No acute intra-abdominal process. 3. 5.4 cm fundal uterine fibroid, increased in size since 2015. Electronically Signed   By: Titus Dubin M.D.   On: 12/30/2020 12:10   CT Abdomen Pelvis W Contrast  Result Date: 12/30/2020 CLINICAL DATA:  Shortness of breath and epigastric pain for the past few weeks. EXAM: CT ANGIOGRAPHY CHEST CT ABDOMEN AND PELVIS WITH CONTRAST TECHNIQUE: Multidetector CT imaging of the chest was performed using the standard protocol during bolus administration of intravenous contrast. Multiplanar CT  image reconstructions and MIPs were obtained to evaluate the vascular anatomy. Multidetector CT imaging of the abdomen and pelvis was performed using the standard protocol during bolus administration of intravenous contrast. CONTRAST:  131mL OMNIPAQUE IOHEXOL 350 MG/ML SOLN COMPARISON:  Chest x-ray from same day. CT abdomen pelvis dated August 18, 2014. FINDINGS: CTA CHEST FINDINGS Cardiovascular: Satisfactory opacification of the pulmonary arteries to the segmental level.  No evidence of pulmonary embolism. Normal heart size. No pericardial effusion. No thoracic aortic aneurysm or dissection. Mediastinum/Nodes: No enlarged mediastinal, hilar, or axillary lymph nodes. Thyroid gland, trachea, and esophagus demonstrate no significant findings. Lungs/Pleura: Lungs are clear. No pleural effusion or pneumothorax. Musculoskeletal: No chest wall abnormality. No acute or significant osseous findings. Review of the MIP images confirms the above findings. CT ABDOMEN AND PELVIS FINDINGS Hepatobiliary: No focal liver abnormality is seen. No gallstones, gallbladder wall thickening, or biliary dilatation. Pancreas: Unremarkable. No pancreatic ductal dilatation or surrounding inflammatory changes. Spleen: Normal in size without focal abnormality. Adrenals/Urinary Tract: Adrenal glands are unremarkable. Kidneys are normal, without renal calculi, focal lesion, or hydronephrosis. Bladder is unremarkable. Stomach/Bowel: Stomach is within normal limits. Appendix appears normal. No evidence of bowel wall thickening, distention, or inflammatory changes. Vascular/Lymphatic: No significant vascular findings are present. No enlarged abdominal or pelvic lymph nodes. Reproductive: 5.4 cm fundal fibroid with submucosal component, previously 2.8 cm in 2015. No adnexal mass. Other: No abdominal wall hernia or abnormality. No abdominopelvic ascites. No pneumoperitoneum. Musculoskeletal: No acute or significant osseous findings. Review of the MIP  images confirms the above findings. IMPRESSION: 1. No evidence of pulmonary embolism. No acute intrathoracic process. 2. No acute intra-abdominal process. 3. 5.4 cm fundal uterine fibroid, increased in size since 2015. Electronically Signed   By: Titus Dubin M.D.   On: 12/30/2020 12:10    Procedures Procedures   Medications Ordered in ED Medications  iohexol (OMNIPAQUE) 350 MG/ML injection 100 mL (100 mLs Intravenous Contrast Given 12/30/20 1113)    ED Course  I have reviewed the triage vital signs and the nursing notes.  Pertinent labs & imaging results that were available during my care of the patient were reviewed by me and considered in my medical decision making (see chart for details).    MDM Rules/Calculators/A&P                          Well-appearing on exam, mildly hypertensive but otherwise reassuring vital signs.  EKG without acute ischemic changes.  Chest x-ray clear.  Lab work shows negative serial troponins, reassuring CBC, D-dimer mildly elevated at 0.53, AST 112 ALT 258 normal bilirubin and alk phos.  Patient has no focal upper abdominal tenderness.  Obtain CTA to rule out PE and also obtain CT of abdomen and pelvis to evaluate liver, gallbladder.  Imaging negative for acute pathology, redemonstrates known uterine fibroid for which she follows with GYN.  The patient's HEART score is 3. Although I am reassured by EKG and trops today, I do feel she would benefit from further cardiac evaluation especially considering her ongoing symptoms and family history.  Because we are heading into the weekend, I doubt that she would be able to have stress test if admitted.  Therefore, contacted cardiology and discussed with Trish who has placed her on list to contact for scheduling close f/u. She may be eligible for coronary CTA.  I have discussed this plan with the patient and emphasized the need for close cardiology follow-up.  Provided their contact information if needed.  I also  discussed incidental finding of elevated LFTs and counseled on alcohol avoidance, cautious use of Tylenol, and need for PCP follow-up for recheck in the future.  I have extensively reviewed return precautions with patient and instructed that if she has worsening symptoms she should report directly to [facility] ED as our cardiologists are there. She voiced understanding.  Final Clinical Impression(s) /  ED Diagnoses Final diagnoses:  DOE (dyspnea on exertion)  Atypical chest pain  Elevated LFTs    Rx / DC Orders ED Discharge Orders    None       Jinan Biggins, Wenda Overland, MD 12/30/20 1407

## 2021-01-06 ENCOUNTER — Other Ambulatory Visit: Payer: Self-pay | Admitting: Nurse Practitioner

## 2021-01-06 DIAGNOSIS — Z1231 Encounter for screening mammogram for malignant neoplasm of breast: Secondary | ICD-10-CM

## 2021-01-12 ENCOUNTER — Other Ambulatory Visit: Payer: Self-pay | Admitting: Obstetrics & Gynecology

## 2021-01-12 NOTE — Telephone Encounter (Signed)
I received e-mail today from the [employer] rep that they have submitted a level two appeal to the insurance carrier.

## 2021-01-12 NOTE — Telephone Encounter (Signed)
Pharmacy requested 90 days Rx.

## 2021-01-26 ENCOUNTER — Encounter: Payer: Self-pay | Admitting: Nurse Practitioner

## 2021-01-26 ENCOUNTER — Other Ambulatory Visit (INDEPENDENT_AMBULATORY_CARE_PROVIDER_SITE_OTHER): Payer: BC Managed Care – PPO

## 2021-01-26 ENCOUNTER — Ambulatory Visit (INDEPENDENT_AMBULATORY_CARE_PROVIDER_SITE_OTHER): Payer: BC Managed Care – PPO | Admitting: Nurse Practitioner

## 2021-01-26 VITALS — BP 102/72 | HR 94 | Ht 60.0 in | Wt 163.0 lb

## 2021-01-26 DIAGNOSIS — K219 Gastro-esophageal reflux disease without esophagitis: Secondary | ICD-10-CM | POA: Diagnosis not present

## 2021-01-26 DIAGNOSIS — R7989 Other specified abnormal findings of blood chemistry: Secondary | ICD-10-CM

## 2021-01-26 LAB — HEPATIC FUNCTION PANEL
ALT: 564 U/L — ABNORMAL HIGH (ref 0–35)
AST: 269 U/L — ABNORMAL HIGH (ref 0–37)
Albumin: 4.6 g/dL (ref 3.5–5.2)
Alkaline Phosphatase: 71 U/L (ref 39–117)
Bilirubin, Direct: 0.1 mg/dL (ref 0.0–0.3)
Total Bilirubin: 0.5 mg/dL (ref 0.2–1.2)
Total Protein: 8 g/dL (ref 6.0–8.3)

## 2021-01-26 NOTE — Patient Instructions (Addendum)
If you are age 52 or older, your body mass index should be between 23-30. Your Body mass index is 31.83 kg/m. If this is out of the aforementioned range listed, please consider follow up with your Primary Care Provider.  If you are age 52 or younger, your body mass index should be between 19-25. Your Body mass index is 31.83 kg/m. If this is out of the aformentioned range listed, please consider follow up with your Primary Care Provider.   __________________________________________________________  The [facility] providers would like to encourage you to use [facility] to communicate with providers for non-urgent requests or questions.  Due to long hold times on the telephone, sending your provider a message by [facility] may be a faster and more efficient way to get a response.  Please allow 48 business hours for a response.  Please remember that this is for non-urgent requests.   ___________________________________________________________  Your provider has requested that you go to the basement level for lab work before leaving today. Press "B" on the elevator. The lab is located at the first door on the left as you exit the elevator.   NO NSAIDs NO Tylenol NO alcohol   Thank you for choosing me and [facility].  Tye Savoy, NP

## 2021-01-26 NOTE — Progress Notes (Signed)
   ASSESSMENT AND PLAN     # 50 yo female with new elevation in liver enzymes, possibly secondary to NSAIDs + Tylenol + Etoh.  Need to exclude other etiologies such as acute viral hepatitis, autoimmune liver disease, etc.  No hepatobiliary findings on CT scan with contrast.  --Absolutely no Tylenol , Advil or Etoh for now --Repeat liver tests today --Check ANA, IgG, viral hepatitis studies --Further recommendations pending above  # Chronic GERD. Asymptomatic (off treatment) for years. Now with intermittent heartburn since January.  Taking Prilosec as needed which helps.  Excessive weight gain probably contributing to flare in GERD symptoms. --Continue Prilosec as needed  # Weight gain, probably related to Megace which she is taking for fibroids.  # SOB ( mainly exertional). Also, several weeks of chest tightness prompting ED visit late April. The chest discomfort has resolved. Still having SOB. Has appt with Cardiology next month.  In the ED her CT angio of the chest was negative.  HISTORY OF PRESENT ILLNESS     Chief Complaint : liver tests are abnormal.   [name] is a 50 y.o. female , known to Dr. Stark from a screening colonoscopy in Oct 2020 (mild diverticulosis, 6 mm sigmoid polyp).  Polyp pathology compatible with benign mucosa. She is here at the request of PCP for evaluation of elevated liver tests.  Patient has a past medical history significant for HLD, menometrorrhagia. See PMH below for any additional history.   12/30/20 ED for evaluation SOB and and a several week history of intermittent chest pain described as tightness. The pain was not at all meal related, just occurred randomly. SOB mainly exertional. No orthopnea.  In the ED her AST was 112, ALT 258.  Alkaline phosphatase and bilirubin were normal.  Historically her liver tests have been normal  Prior to labs in the ED on 4/29 patient had been drinking 2-3 beers every other day . She had been taking Advil and  Tylenol on a regular basis for months for fibroid pain. She was advised by ED to stop both OTC meds and Etoh .  She has not had any more alcohol but has taken a few more doses of Advil .  Repeat liver tests at PCPs office 01/13/2021 showed worsening liver enzymes.  Her AST was 223, ALT 440. She has since had only one dose of Advil.  She takes a One A Day vitamin, fish oil , Vitamin D, and hair and nail vitamins.  The chest pain has resolved but she is still SOB at times. She attributes symptoms to weight gain. She has gained 20-30 pounds over the last year. Sees Cardiology on 6/27.    Data Reviewed:  12/30/20 ED AST 112 / ALT 250 Normal Bili and Alk Phos CBC ok  CTA chest / CT abd and pelvis w/ contrast Done in ED for chest pain --No hepatobiliary abnormalities --No PE --Uterine fibroid  01/13/21 Normal Bili and Alk phos AST 223 / ALT 440   Past Medical History:  Diagnosis Date  . Allergies   . Complication of anesthesia    Per pt,"hard to wake up past sedation".  . Fibroid uterus   . Post-operative nausea and vomiting      Past Surgical History:  Procedure Laterality Date  . CESAREAN SECTION     2 times  . LAPAROSCOPIC ASSISTED VENTRAL HERNIA REPAIR N/A 10/11/2014   Procedure: LAPAROSCOPIC ASSISTED VENTRAL WALL HERNIA REPAIR;  Surgeon: Steven Gross, MD;  Location: [facility]   ORS;  Service: General;  Laterality: N/A;  . UMBILICAL HERNIA REPAIR N/A 10/11/2014   Procedure: HERNIA REPAIR UMBILICAL ADULT;  Surgeon: Steven Gross, MD;  Location: [facility] ORS;  Service: General;  Laterality: N/A;  with MESH   Family History  Problem Relation Age of Onset  . COPD Mother   . Hypertension Mother   . Hypertension Father   . Rectal cancer Father   . Breast cancer Maternal Aunt        after menopause  . Cancer Maternal Aunt        breast  . Cancer Maternal Grandmother        ovarian or cervical  . Hypertension Brother   . Diabetes Brother   . Breast cancer Cousin   . Breast cancer Maternal  Aunt   . Diabetes Brother   . Stomach cancer Neg Hx   . Esophageal cancer Neg Hx    Social History   Tobacco Use  . Smoking status: Never Smoker  . Smokeless tobacco: Never Used  Vaping Use  . Vaping Use: Never used  Substance Use Topics  . Alcohol use: Not Currently    Alcohol/week: 2.0 standard drinks    Types: 2 Cans of beer per week    Comment: rare/ socially  . Drug use: No   Current Outpatient Medications  Medication Sig Dispense Refill  . Cholecalciferol (VITAMIN D) 125 MCG (5000 UT) CAPS Take by mouth daily.    . fluticasone (FLONASE) 50 MCG/ACT nasal spray daily as needed.    . ibuprofen (ADVIL,MOTRIN) 600 MG tablet Take 1 tablet (600 mg total) by mouth every 6 (six) hours as needed for mild pain or moderate pain. 40 tablet 1  . megestrol (MEGACE) 40 MG tablet TAKE 1 TABLET BY MOUTH TWICE A DAY 180 tablet 0  . Multiple Vitamin (MULTIVITAMIN WITH MINERALS) TABS tablet Take 1 tablet by mouth daily.    . Omega-3 Fatty Acids (FISH OIL) 1000 MG CPDR Take by mouth daily.     No current facility-administered medications for this visit.   Allergies  Allergen Reactions  . Oxycodone-Acetaminophen Hives    Other reaction(s): hives  . Percocet [Oxycodone-Acetaminophen] Hives  . Flexeril [Cyclobenzaprine]      Review of Systems:  All systems reviewed and negative except where noted in HPI.    PHYSICAL EXAM :    Wt Readings from Last 3 Encounters:  01/26/21 163 lb (73.9 kg)  12/30/20 150 lb (68 kg)  10/12/20 158 lb (71.7 kg)    BP 102/72   Pulse 94   Ht 5' (1.524 m)   Wt 163 lb (73.9 kg)   SpO2 99%   BMI 31.83 kg/m  Constitutional:  Pleasant female in no acute distress. Psychiatric: Normal mood and affect. Behavior is normal. EENT: Pupils normal.  Conjunctivae are normal. No scleral icterus. Neck supple.  Cardiovascular: Normal rate, regular rhythm. No edema Pulmonary/chest: Effort normal and breath sounds normal. No wheezing, rales or rhonchi. Abdominal:  Soft, nondistended, nontender. Bowel sounds active throughout. There are no masses palpable. No hepatomegaly. Neurological: Alert and oriented to person place and time. Skin: Skin is warm and dry. No rashes noted.  Paula Guenther, NP  01/26/2021, 2:54 PM  Cc:  Referring Provider Rankins, Victoria R, MD        

## 2021-01-27 NOTE — Progress Notes (Signed)
Reviewed and agree with management plan.  Nyshawn Gowdy T. Quanta Robertshaw, MD FACG [phone]  

## 2021-01-31 LAB — HEPATITIS B CORE ANTIBODY, TOTAL: Hep B Core Total Ab: NONREACTIVE

## 2021-01-31 LAB — HEPATITIS B SURFACE ANTIGEN: Hepatitis B Surface Ag: NONREACTIVE

## 2021-01-31 LAB — TEST AUTHORIZATION

## 2021-01-31 LAB — HEPATITIS C ANTIBODY
Hepatitis C Ab: NONREACTIVE
SIGNAL TO CUT-OFF: 0.02 (ref <1.00)

## 2021-01-31 LAB — ANA: Anti Nuclear Antibody (ANA): NEGATIVE

## 2021-01-31 LAB — HEPATITIS A ANTIBODY, TOTAL: Hepatitis A AB,Total: NONREACTIVE

## 2021-01-31 LAB — IGG: IgG (Immunoglobin G), Serum: 1632 mg/dL (ref 600–1640)

## 2021-01-31 LAB — HEPATITIS B SURFACE ANTIBODY,QUALITATIVE: Hep B S Ab: NONREACTIVE

## 2021-02-02 ENCOUNTER — Telehealth: Payer: Self-pay

## 2021-02-02 NOTE — Telephone Encounter (Signed)
I checked with Thomasena Edis, [employer] Rep, on 01/31/21 regarding the status of this case. She wrote "waiting for Ms. [name] with [employer] to scheduled the L2 panel appeal". Patient is aware.

## 2021-02-02 NOTE — Telephone Encounter (Signed)
Patient is still awaiting Biochemist, clinical for [employer]. The [employer] rep is waiting for [employer] rep to schedule a Level L2 panel appeal.  Patient has been taking Megestrol daily.   She has been diagnosed with extremely elevated LFT's a month ago when in ER.  Her PCP arranged urgent referral to GI Dr.   Par[facility] Ranger was a month later on 01/27/21 and the LFT's have increased even more.   Patient expressed that she is concerned the Megace is causing this rise in LFT's. She said it is the only medication she takes regularly. She has used Ibuprofen and Tylenol for pain when that occurs but not daily/regularly.  Also, she complained of weight gain the the Megace.  She was asking about possibly going off the Megestrol and onto a low dose bc pill to control the pain/bleeding and see if LFT's will improve.    Per her Epic chart her GI MD has not yet reviewed labs from 01/27/21 and advised her regarding results.

## 2021-02-07 ENCOUNTER — Other Ambulatory Visit: Payer: Self-pay | Admitting: Nurse Practitioner

## 2021-02-07 DIAGNOSIS — R748 Abnormal levels of other serum enzymes: Secondary | ICD-10-CM

## 2021-02-09 ENCOUNTER — Other Ambulatory Visit (INDEPENDENT_AMBULATORY_CARE_PROVIDER_SITE_OTHER): Payer: BC Managed Care – PPO

## 2021-02-09 DIAGNOSIS — R748 Abnormal levels of other serum enzymes: Secondary | ICD-10-CM

## 2021-02-09 LAB — HEPATIC FUNCTION PANEL
ALT: 307 U/L — ABNORMAL HIGH (ref 0–35)
AST: 136 U/L — ABNORMAL HIGH (ref 0–37)
Albumin: 4.7 g/dL (ref 3.5–5.2)
Alkaline Phosphatase: 65 U/L (ref 39–117)
Bilirubin, Direct: 0.1 mg/dL (ref 0.0–0.3)
Total Bilirubin: 0.9 mg/dL (ref 0.2–1.2)
Total Protein: 8.3 g/dL (ref 6.0–8.3)

## 2021-02-10 ENCOUNTER — Other Ambulatory Visit: Payer: Self-pay

## 2021-02-10 DIAGNOSIS — R748 Abnormal levels of other serum enzymes: Secondary | ICD-10-CM

## 2021-02-15 ENCOUNTER — Other Ambulatory Visit (INDEPENDENT_AMBULATORY_CARE_PROVIDER_SITE_OTHER): Payer: BC Managed Care – PPO

## 2021-02-15 DIAGNOSIS — R748 Abnormal levels of other serum enzymes: Secondary | ICD-10-CM | POA: Diagnosis not present

## 2021-02-15 LAB — HEPATIC FUNCTION PANEL
ALT: 232 U/L — ABNORMAL HIGH (ref 0–35)
AST: 109 U/L — ABNORMAL HIGH (ref 0–37)
Albumin: 4.6 g/dL (ref 3.5–5.2)
Alkaline Phosphatase: 64 U/L (ref 39–117)
Bilirubin, Direct: 0.1 mg/dL (ref 0.0–0.3)
Total Bilirubin: 0.6 mg/dL (ref 0.2–1.2)
Total Protein: 7.8 g/dL (ref 6.0–8.3)

## 2021-02-21 ENCOUNTER — Telehealth: Payer: Self-pay | Admitting: Nurse Practitioner

## 2021-02-21 LAB — CERULOPLASMIN: Ceruloplasmin: 30.3 mg/dL (ref 19.0–39.0)

## 2021-02-21 LAB — IRON AND TIBC
Iron Saturation: 29 % (ref 15–55)
Iron: 103 ug/dL (ref 27–159)
Total Iron Binding Capacity: 350 ug/dL (ref 250–450)
UIBC: 247 ug/dL (ref 131–425)

## 2021-02-21 LAB — CELIAC AB TTG DGP TIGA
Antigliadin Abs, IgA: 5 units (ref 0–19)
Gliadin IgG: 2 units (ref 0–19)
IgA/Immunoglobulin A, Serum: 195 mg/dL (ref 87–352)
Tissue Transglut Ab: 2 U/mL (ref 0–5)
Transglutaminase IgA: <2 U/mL (ref 0–3)

## 2021-02-21 LAB — MITOCHONDRIAL ANTIBODIES: Mitochondrial Ab: <20 Units (ref 0.0–20.0)

## 2021-02-21 LAB — ALPHA-1-ANTITRYPSIN: A-1 Antitrypsin: 127 mg/dL (ref 101–187)

## 2021-02-21 LAB — ANTI-SMOOTH MUSCLE ANTIBODY, IGG: Smooth Muscle Ab: 34 Units — ABNORMAL HIGH (ref 0–19)

## 2021-02-21 NOTE — Telephone Encounter (Signed)
See telephone encounter for more information. 

## 2021-02-21 NOTE — Telephone Encounter (Signed)
Patient notified and made aware of results and rec's. Pt verbalized understanding and nothing further needed at this time. (InResult Note)

## 2021-02-21 NOTE — Telephone Encounter (Signed)
Patient calling returning missed call.. thanks

## 2021-02-26 NOTE — Progress Notes (Deleted)
[facility]  Cardiology Office Note:   Date:  02/26/2021  NAME:  [name]    MRN: [id number] DOB:  07-10-1971   PCP:  Aretta Nip, MD  Cardiologist:  None  Electrophysiologist:  None   Referring MD: Aretta Nip, MD   No chief complaint on file. ***  History of Present Illness:   [name] is a 50 y.o. female with a hx of GERD, elevated liver enzymes who is being seen today for the evaluation of chest pain/SOB at the request of Rankins, Bill Salinas, MD.  CT PE study 12/30/2020 -> 0 CAC  Problem List GERD HLD -T chol 213, HDL 41, LDL 140, TG 172 Elevated liver enzymes  -+anti-smooth muscle Ab  Past Medical History: Past Medical History:  Diagnosis Date   Allergies    Complication of anesthesia    Per pt,"hard to wake up past sedation".   Fibroid uterus    Post-operative nausea and vomiting     Past Surgical History: Past Surgical History:  Procedure Laterality Date   CESAREAN SECTION     2 times   LAPAROSCOPIC ASSISTED VENTRAL HERNIA REPAIR N/A 10/11/2014   Procedure: LAPAROSCOPIC ASSISTED VENTRAL WALL HERNIA REPAIR;  Surgeon: Michael Boston, MD;  Location: [facility];  Service: General;  Laterality: N/A;   UMBILICAL HERNIA REPAIR N/A 10/11/2014   Procedure: HERNIA REPAIR UMBILICAL ADULT;  Surgeon: Michael Boston, MD;  Location: [facility];  Service: General;  Laterality: N/A;  with MESH    Current Medications: No outpatient medications have been marked as taking for the 02/27/21 encounter (Appointment) with O'Neal, Cassie Freer, MD.     Allergies:    Oxycodone-acetaminophen, Percocet [oxycodone-acetaminophen], and Flexeril [cyclobenzaprine]   Social History: Social History   Socioeconomic History   Marital status: Single    Spouse name: Not on file   Number of children: Not on file   Years of education: Not on file   Highest education level: Not on file  Occupational History   Not on file  Tobacco Use    Smoking status: Never   Smokeless tobacco: Never  Vaping Use   Vaping Use: Never used  Substance and Sexual Activity   Alcohol use: Not Currently    Alcohol/week: 2.0 standard drinks    Types: 2 Cans of beer per week    Comment: rare/ socially   Drug use: No   Sexual activity: Not Currently    Birth control/protection: Other-see comments, None    Comment: Vasectomy  Other Topics Concern   Not on file  Social History Narrative   Not on file   Social Determinants of Health   Financial Resource Strain: Not on file  Food Insecurity: Not on file  Transportation Needs: Not on file  Physical Activity: Not on file  Stress: Not on file  Social Connections: Not on file     Family History: The patient's ***family history includes Breast cancer in her cousin, maternal aunt, and maternal aunt; COPD in her mother; Cancer in her maternal aunt and maternal grandmother; Diabetes in her brother and brother; Hypertension in her brother, father, and mother; Rectal cancer in her father. There is no history of Stomach cancer or Esophageal cancer.  ROS:   All other ROS reviewed and negative. Pertinent positives noted in the HPI.     EKGs/Labs/Other Studies Reviewed:   The following studies were personally reviewed by me today:  EKG:  EKG is *** ordered  today.  The ekg ordered today demonstrates ***, and was personally reviewed by me.   Recent Labs: 08/22/2020: TSH 3.19 12/30/2020: BUN 10; Creatinine, Ser 0.70; Hemoglobin 13.1; Platelets 278; Potassium 3.7; Sodium 138 02/15/2021: ALT 232   Recent Lipid Panel    Component Value Date/Time   CHOL 213 (H) 11/16/2019 0842   TRIG 186 (H) 11/16/2019 0842   HDL 41 (L) 11/16/2019 0842   CHOLHDL 5.2 (H) 11/16/2019 0842   VLDL 24 12/05/2016 1030   LDLCALC 140 (H) 11/16/2019 3614    Physical Exam:   VS:  There were no vitals taken for this visit.   Wt Readings from Last 3 Encounters:  01/26/21 163 lb (73.9 kg)  12/30/20 150 lb (68 kg)   10/12/20 158 lb (71.7 kg)    General: Well nourished, well developed, in no acute distress Head: Atraumatic, normal size  Eyes: PEERLA, EOMI  Neck: Supple, no JVD Endocrine: No thryomegaly Cardiac: Normal S1, S2; RRR; no murmurs, rubs, or gallops Lungs: Clear to auscultation bilaterally, no wheezing, rhonchi or rales  Abd: Soft, nontender, no hepatomegaly  Ext: No edema, pulses 2+ Musculoskeletal: No deformities, BUE and BLE strength normal and equal Skin: Warm and dry, no rashes   Neuro: Alert and oriented to person, place, time, and situation, CNII-XII grossly intact, no focal deficits  Psych: Normal mood and affect   ASSESSMENT:   [name] is a 50 y.o. female who presents for the following: No diagnosis found.  PLAN:   There are no diagnoses linked to this encounter.  {Are you ordering a CV Procedure (e.g. stress test, cath, DCCV, TEE, etc)?   Press F2        :[id number]}  Disposition: No follow-ups on file.  Medication Adjustments/Labs and Tests Ordered: Current medicines are reviewed at length with the patient today.  Concerns regarding medicines are outlined above.  No orders of the defined types were placed in this encounter.  No orders of the defined types were placed in this encounter.   There are no Patient Instructions on file for this visit.   Time Spent with Patient: I have spent a total of *** minutes with patient reviewing hospital notes, telemetry, EKGs, labs and examining the patient as well as establishing an assessment and plan that was discussed with the patient.  > 50% of time was spent in direct patient care.  Signed, Addison Naegeli. Audie Box, MD, [facility]  [address] [place], [place] [address] [phone]  02/26/2021 4:36 PM

## 2021-02-27 ENCOUNTER — Ambulatory Visit (HOSPITAL_BASED_OUTPATIENT_CLINIC_OR_DEPARTMENT_OTHER): Payer: BC Managed Care – PPO | Admitting: Cardiovascular Disease

## 2021-03-02 ENCOUNTER — Ambulatory Visit
Admission: RE | Admit: 2021-03-02 | Discharge: 2021-03-02 | Disposition: A | Discharge status: Home / Self Care | Payer: BC Managed Care – PPO | Source: Ambulatory Visit | Attending: Nurse Practitioner | Admitting: Nurse Practitioner

## 2021-03-02 ENCOUNTER — Other Ambulatory Visit: Payer: Self-pay

## 2021-03-02 DIAGNOSIS — Z1231 Encounter for screening mammogram for malignant neoplasm of breast: Secondary | ICD-10-CM

## 2021-03-12 ENCOUNTER — Other Ambulatory Visit: Payer: Self-pay | Admitting: Obstetrics & Gynecology

## 2021-04-06 ENCOUNTER — Other Ambulatory Visit: Payer: Self-pay

## 2021-04-06 ENCOUNTER — Telehealth: Payer: Self-pay | Admitting: Nurse Practitioner

## 2021-04-06 DIAGNOSIS — R748 Abnormal levels of other serum enzymes: Secondary | ICD-10-CM

## 2021-04-06 NOTE — Telephone Encounter (Signed)
Called the patient. No answer. Left her a message. She does not need an appointment for labs here. Her labs do not require that she fast. Hours for the lab are 7:30 am until 5:15 pm Monday through Friday. I will put her orders in the lab. Come when she can.

## 2021-04-07 ENCOUNTER — Other Ambulatory Visit (INDEPENDENT_AMBULATORY_CARE_PROVIDER_SITE_OTHER): Payer: BC Managed Care – PPO

## 2021-04-07 DIAGNOSIS — R748 Abnormal levels of other serum enzymes: Secondary | ICD-10-CM

## 2021-04-07 LAB — HEPATIC FUNCTION PANEL
ALT: 39 U/L — ABNORMAL HIGH (ref 0–35)
AST: 29 U/L (ref 0–37)
Albumin: 4.4 g/dL (ref 3.5–5.2)
Alkaline Phosphatase: 58 U/L (ref 39–117)
Bilirubin, Direct: 0.1 mg/dL (ref 0.0–0.3)
Total Bilirubin: 0.5 mg/dL (ref 0.2–1.2)
Total Protein: 7.9 g/dL (ref 6.0–8.3)

## 2021-04-10 ENCOUNTER — Other Ambulatory Visit: Payer: Self-pay

## 2021-04-10 DIAGNOSIS — R748 Abnormal levels of other serum enzymes: Secondary | ICD-10-CM

## 2021-04-17 NOTE — Progress Notes (Deleted)
Cardiology Office Note:    Date:  04/17/2021   ID:  [name], DOB 1971-08-19, MRN [id number]  PCP:  Aretta Nip, MD  Cardiologist:  None  Electrophysiologist:  None   Referring MD: Aretta Nip, MD   No chief complaint on file. ***  History of Present Illness:    [name] is a 50 y.o. female with a hx of ***who is referred by Dr. Radene Ou for evaluation of chest pain.  Past Medical History:  Diagnosis Date   Allergies    Complication of anesthesia    Per pt,"hard to wake up past sedation".   Fibroid uterus    Post-operative nausea and vomiting     Past Surgical History:  Procedure Laterality Date   CESAREAN SECTION     2 times   LAPAROSCOPIC ASSISTED VENTRAL HERNIA REPAIR N/A 10/11/2014   Procedure: LAPAROSCOPIC ASSISTED VENTRAL WALL HERNIA REPAIR;  Surgeon: Michael Boston, MD;  Location: [facility];  Service: General;  Laterality: N/A;   UMBILICAL HERNIA REPAIR N/A 10/11/2014   Procedure: HERNIA REPAIR UMBILICAL ADULT;  Surgeon: Michael Boston, MD;  Location: [facility];  Service: General;  Laterality: N/A;  with MESH    Current Medications: No outpatient medications have been marked as taking for the 04/21/21 encounter (Appointment) with Donato Heinz, MD.     Allergies:   Oxycodone-acetaminophen, Percocet [oxycodone-acetaminophen], and Flexeril [cyclobenzaprine]   Social History   Socioeconomic History   Marital status: Single    Spouse name: Not on file   Number of children: Not on file   Years of education: Not on file   Highest education level: Not on file  Occupational History   Not on file  Tobacco Use   Smoking status: Never   Smokeless tobacco: Never  Vaping Use   Vaping Use: Never used  Substance and Sexual Activity   Alcohol use: Not Currently    Alcohol/week: 2.0 standard drinks    Types: 2 Cans of beer per week    Comment: rare/ socially   Drug use: No   Sexual activity: Not Currently    Birth control/protection:  Other-see comments, None    Comment: Vasectomy  Other Topics Concern   Not on file  Social History Narrative   Not on file   Social Determinants of Health   Financial Resource Strain: Not on file  Food Insecurity: Not on file  Transportation Needs: Not on file  Physical Activity: Not on file  Stress: Not on file  Social Connections: Not on file     Family History: The patient's ***family history includes Breast cancer in her cousin, maternal aunt, and maternal aunt; COPD in her mother; Cancer in her maternal aunt and maternal grandmother; Diabetes in her brother and brother; Hypertension in her brother, father, and mother; Rectal cancer in her father. There is no history of Stomach cancer or Esophageal cancer.  ROS:   Please see the history of present illness.    *** All other systems reviewed and are negative.  EKGs/Labs/Other Studies Reviewed:    The following studies were reviewed today: ***  EKG:  EKG is *** ordered today.  The ekg ordered today demonstrates ***  Recent Labs: 08/22/2020: TSH 3.19 12/30/2020: BUN 10; Creatinine, Ser 0.70; Hemoglobin 13.1; Platelets 278; Potassium 3.7; Sodium 138 04/07/2021: ALT 39  Recent Lipid Panel    Component Value Date/Time   CHOL 213 (H) 11/16/2019 0842   TRIG 186 (H) 11/16/2019 0842   HDL 41 (L)  11/16/2019 0842   CHOLHDL 5.2 (H) 11/16/2019 0842   VLDL 24 12/05/2016 1030   LDLCALC 140 (H) 11/16/2019 [id number]    Physical Exam:    VS:  There were no vitals taken for this visit.    Wt Readings from Last 3 Encounters:  01/26/21 163 lb (73.9 kg)  12/30/20 150 lb (68 kg)  10/12/20 158 lb (71.7 kg)     GEN: *** Well nourished, well developed in no acute distress HEENT: Normal NECK: No JVD; No carotid bruits LYMPHATICS: No lymphadenopathy CARDIAC: ***RRR, no murmurs, rubs, gallops RESPIRATORY:  Clear to auscultation without rales, wheezing or rhonchi  ABDOMEN: Soft, non-tender, non-distended MUSCULOSKELETAL:  No edema; No  deformity  SKIN: Warm and dry NEUROLOGIC:  Alert and oriented x 3 PSYCHIATRIC:  Normal affect   ASSESSMENT:    No diagnosis found. PLAN:    In order of problems listed above:  ***   Medication Adjustments/Labs and Tests Ordered: Current medicines are reviewed at length with the patient today.  Concerns regarding medicines are outlined above.  No orders of the defined types were placed in this encounter.  No orders of the defined types were placed in this encounter.   There are no Patient Instructions on file for this visit.   Signed, Donato Heinz, MD  04/17/2021 9:35 PM    [facility]

## 2021-04-20 ENCOUNTER — Emergency Department (HOSPITAL_BASED_OUTPATIENT_CLINIC_OR_DEPARTMENT_OTHER): Payer: BC Managed Care – PPO | Admitting: Radiology

## 2021-04-20 ENCOUNTER — Encounter (HOSPITAL_BASED_OUTPATIENT_CLINIC_OR_DEPARTMENT_OTHER): Payer: Self-pay

## 2021-04-20 ENCOUNTER — Emergency Department (HOSPITAL_BASED_OUTPATIENT_CLINIC_OR_DEPARTMENT_OTHER)
Admission: EM | Admit: 2021-04-20 | Discharge: 2021-04-20 | Disposition: A | Discharge status: Home / Self Care | Payer: BC Managed Care – PPO | Attending: Emergency Medicine | Admitting: Emergency Medicine

## 2021-04-20 ENCOUNTER — Other Ambulatory Visit: Payer: Self-pay

## 2021-04-20 DIAGNOSIS — S4991XA Unspecified injury of right shoulder and upper arm, initial encounter: Secondary | ICD-10-CM | POA: Diagnosis present

## 2021-04-20 DIAGNOSIS — S199XXA Unspecified injury of neck, initial encounter: Secondary | ICD-10-CM | POA: Diagnosis not present

## 2021-04-20 DIAGNOSIS — R0789 Other chest pain: Secondary | ICD-10-CM | POA: Insufficient documentation

## 2021-04-20 DIAGNOSIS — M545 Low back pain, unspecified: Secondary | ICD-10-CM | POA: Diagnosis not present

## 2021-04-20 DIAGNOSIS — S5011XA Contusion of right forearm, initial encounter: Secondary | ICD-10-CM | POA: Insufficient documentation

## 2021-04-20 DIAGNOSIS — S0990XA Unspecified injury of head, initial encounter: Secondary | ICD-10-CM | POA: Diagnosis not present

## 2021-04-20 DIAGNOSIS — Y92410 Unspecified street and highway as the place of occurrence of the external cause: Secondary | ICD-10-CM | POA: Diagnosis not present

## 2021-04-20 DIAGNOSIS — S40021A Contusion of right upper arm, initial encounter: Secondary | ICD-10-CM | POA: Insufficient documentation
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
## 2021-04-20 NOTE — ED Notes (Signed)
Patient transported to X-ray 

## 2021-04-20 NOTE — ED Notes (Signed)
Right arm/shoulder immobilizer placed and reviewed instructions on donning/doffing with patient.the patient stated understanding. Reviewed dc instructions and pt voiced understanding.
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
## 2021-04-21 ENCOUNTER — Ambulatory Visit (HOSPITAL_BASED_OUTPATIENT_CLINIC_OR_DEPARTMENT_OTHER): Payer: BC Managed Care – PPO | Admitting: Cardiology

## 2021-05-12 ENCOUNTER — Telehealth: Payer: Self-pay

## 2021-05-12 NOTE — Telephone Encounter (Signed)
[employer] Approval Received.  Dr. Dellis Filbert, please confirm surgery intake information.   Surgery: Hysteroscopy/D&C/ [facility]/[employer] Procedure  Diagnosis: D25.2, N92.1, N94.6  Location: [place]  Status: Outpatient  Time: 33 Minutes  Assistant: N/A  Urgency: First Available  Pre-Op Appointment: To Be Scheduled  Post-Op Appointment(s): 2 Weeks  Time Out Of Work: Day Of Surgery, 1 Day Post Op

## 2021-05-15 NOTE — Telephone Encounter (Signed)
Spoke with patient regarding surgery benefits. Patient acknowledges understanding of information presented. Patient is aware that benefits presented are professional benefits only. Patient is aware that once surgery is scheduled, the hospital will call with separate benefits. See account note.  Routing to Jill Hamm, RN, for surgery scheduling. 

## 2021-05-16 NOTE — Telephone Encounter (Signed)
Spoke with patient. States now is not a good time to discuss surgery, she will return call later today.

## 2021-06-28 NOTE — Telephone Encounter (Signed)
Call placed to patient. No answer. Voicemail not set up.   MyChart message to patient.

## 2021-07-04 NOTE — Telephone Encounter (Signed)
Andrea Bruins, MD  You 25 minutes ago (3:07 PM)   No further action.  Let patient contact us when she is ready.     Routing to [employer].   Encounter closed.

## 2021-07-04 NOTE — Telephone Encounter (Signed)
No return call from patient.   MyChart message not read.   Dr. Dellis Filbert -please advise.

## 2022-02-14 ENCOUNTER — Other Ambulatory Visit: Payer: Self-pay | Admitting: Obstetrics and Gynecology

## 2022-02-14 ENCOUNTER — Other Ambulatory Visit: Payer: Self-pay | Admitting: Nurse Practitioner

## 2022-02-14 DIAGNOSIS — Z1231 Encounter for screening mammogram for malignant neoplasm of breast: Secondary | ICD-10-CM

## 2022-03-21 ENCOUNTER — Ambulatory Visit
Admission: RE | Admit: 2022-03-21 | Discharge: 2022-03-21 | Disposition: A | Discharge status: Home / Self Care | Payer: BC Managed Care – PPO | Source: Ambulatory Visit | Attending: Obstetrics and Gynecology | Admitting: Obstetrics and Gynecology

## 2022-03-21 DIAGNOSIS — Z1231 Encounter for screening mammogram for malignant neoplasm of breast: Secondary | ICD-10-CM

## 2023-01-31 ENCOUNTER — Ambulatory Visit: Payer: BC Managed Care – PPO | Admitting: Podiatry

## 2023-02-12 ENCOUNTER — Ambulatory Visit (INDEPENDENT_AMBULATORY_CARE_PROVIDER_SITE_OTHER): Payer: BC Managed Care – PPO | Admitting: Podiatry

## 2023-02-12 DIAGNOSIS — Z91199 Patient's noncompliance with other medical treatment and regimen due to unspecified reason: Secondary | ICD-10-CM

## 2023-02-13 NOTE — Progress Notes (Signed)
Patient was no-show for appointment today 

## 2023-02-28 ENCOUNTER — Other Ambulatory Visit: Payer: Self-pay | Admitting: Obstetrics and Gynecology

## 2023-02-28 DIAGNOSIS — Z1231 Encounter for screening mammogram for malignant neoplasm of breast: Secondary | ICD-10-CM

## 2023-03-28 ENCOUNTER — Ambulatory Visit
Admission: RE | Admit: 2023-03-28 | Discharge: 2023-03-28 | Disposition: A | Discharge status: Home / Self Care | Payer: BC Managed Care – PPO | Source: Ambulatory Visit | Attending: Obstetrics and Gynecology | Admitting: Obstetrics and Gynecology

## 2023-03-28 DIAGNOSIS — Z1231 Encounter for screening mammogram for malignant neoplasm of breast: Secondary | ICD-10-CM

## 2023-07-04 ENCOUNTER — Ambulatory Visit: Payer: Self-pay

## 2024-04-26 ENCOUNTER — Encounter (HOSPITAL_BASED_OUTPATIENT_CLINIC_OR_DEPARTMENT_OTHER): Payer: Self-pay

## 2024-04-26 ENCOUNTER — Other Ambulatory Visit: Payer: Self-pay

## 2024-04-26 ENCOUNTER — Emergency Department (HOSPITAL_BASED_OUTPATIENT_CLINIC_OR_DEPARTMENT_OTHER)

## 2024-04-26 ENCOUNTER — Emergency Department (HOSPITAL_BASED_OUTPATIENT_CLINIC_OR_DEPARTMENT_OTHER)
Admission: EM | Admit: 2024-04-26 | Discharge: 2024-04-26 | Disposition: A | Discharge status: Home / Self Care | Attending: Emergency Medicine | Admitting: Emergency Medicine

## 2024-04-26 DIAGNOSIS — M25561 Pain in right knee: Secondary | ICD-10-CM | POA: Diagnosis present

## 2024-04-26 NOTE — Discharge Instructions (Addendum)
 Evaluation for your right knee pain was overall reassuring. Ultrasound today was negative for DVT. Please follow-up with your orthopedic doctor.  In the meantime recommend Tylenol ibuprofen  for pain.

## 2024-04-26 NOTE — ED Provider Notes (Signed)
 [place] EMERGENCY DEPARTMENT AT [facility] Provider Note   CSN: [id number] Arrival date & time: 04/26/24  1014     Patient presents with: Knee Pain  HPI [name] is a 53 y.o. female presenting for right knee pain.  Has been going on for 6 months.  Worse in the last month.  Had an MRI of her knee on August 10.  Pain is primarily about the medial aspect of the knee joint but extends to the upper leg.  She states she was advised by [facility] to come here for concern of possible blood clot.  Denies OCP use, recent long trips or immobilization and known history of clots.  Denies any new trauma.    Knee Pain      Prior to Admission medications   Medication Sig Start Date End Date Taking? Authorizing Provider  Cholecalciferol (VITAMIN D) 125 MCG (5000 UT) CAPS Take by mouth daily.    [provider]  fluticasone (FLONASE) 50 MCG/ACT nasal spray daily as needed.    [provider]  ibuprofen  (ADVIL ,MOTRIN ) 600 MG tablet Take 1 tablet (600 mg total) by mouth every 6 (six) hours as needed for mild pain or moderate pain. 10/11/14   Sheldon Standing, MD  megestrol  (MEGACE ) 40 MG tablet TAKE 1 TABLET BY MOUTH TWICE A DAY 03/13/21   Lavoie, Marie-Lyne, MD  Multiple Vitamin (MULTIVITAMIN WITH MINERALS) TABS tablet Take 1 tablet by mouth daily.    [provider]  Omega-3 Fatty Acids (FISH OIL) 1000 MG CPDR Take by mouth daily.    [provider]    Allergies: Oxycodone-acetaminophen, Percocet [oxycodone-acetaminophen], and Flexeril [cyclobenzaprine]    Review of Systems See HPI  Updated Vital Signs BP 133/76 (BP Location: Right Arm)   Pulse 80   Temp 98.6 F (37 C)   Resp 16   Ht 5' (1.524 m)   Wt 72.6 kg   SpO2 99%   BMI 31.25 kg/m   Physical Exam Constitutional:      Appearance: Normal appearance.  HENT:     Head: Normocephalic.     Nose: Nose normal.  Eyes:     Conjunctiva/sclera: Conjunctivae normal.  Pulmonary:     Effort:  Pulmonary effort is normal.  Musculoskeletal:     Comments: Right knee appears grossly normal without erythema or edema or evidence of effusion.  Range of motion is normal with some tenderness to the medial aspect of the knee.  No appreciable swelling or erythema in the leg.  Pedal pulse is 2+ bilaterally.  Patient is ambulatory with steady gait.  Neurological:     Mental Status: She is alert.  Psychiatric:        Mood and Affect: Mood normal.     (all labs ordered are listed, but only abnormal results are displayed) Labs Reviewed - No data to display  EKG: None  Radiology: US  Venous Img Lower Right (DVT Study) Result Date: 04/26/2024 CLINICAL DATA:  Right knee pain. EXAM: RIGHT LOWER EXTREMITY VENOUS DOPPLER ULTRASOUND TECHNIQUE: Gray-scale sonography with graded compression, as well as color Doppler and duplex ultrasound were performed to evaluate the lower extremity deep venous systems from the level of the common femoral vein and including the common femoral, femoral, profunda femoral, popliteal and calf veins including the posterior tibial, peroneal and gastrocnemius veins when visible. The superficial great saphenous vein was also interrogated. Spectral Doppler was utilized to evaluate flow at rest and with distal augmentation maneuvers in the common femoral, femoral and  popliteal veins. COMPARISON:  None Available. FINDINGS: Contralateral Common Femoral Vein: Respiratory phasicity is normal and symmetric with the symptomatic side. No evidence of thrombus. Normal compressibility. Common Femoral Vein: No evidence of thrombus. Normal compressibility, respiratory phasicity and response to augmentation. Saphenofemoral Junction: No evidence of thrombus. Normal compressibility and flow on color Doppler imaging. Profunda Femoral Vein: No evidence of thrombus. Normal compressibility and flow on color Doppler imaging. Femoral Vein: No evidence of thrombus. Normal compressibility, respiratory phasicity  and response to augmentation. Popliteal Vein: No evidence of thrombus. Normal compressibility, respiratory phasicity and response to augmentation. Calf Veins: No evidence of thrombus. Normal compressibility and flow on color Doppler imaging. Superficial Great Saphenous Vein: No evidence of thrombus. Normal compressibility. Venous Reflux:  None. Other Findings: No evidence of superficial thrombophlebitis or abnormal fluid collection. IMPRESSION: No evidence of right lower extremity deep venous thrombosis. Electronically Signed   By: Marcey Moan M.D.   On: 04/26/2024 11:27     Procedures   Medications Ordered in the ED - No data to display                                  Medical Decision Making  53 year old well-appearing female presenting for right knee pain has been going on for 6 months.  Exam was unremarkable with no evidence of trauma or infection.  Given the distribution of her pain there was some concern for DVT.  Ultrasound study was negative however.  Likely chronic knee pain. Advised her to follow-up with her orthopedic doctor for her ongoing knee pain.  Advised RICE treatment with NSAIDs.  Discharged in good condition.     Final diagnoses:  Right knee pain, unspecified chronicity    ED Discharge Orders     None          Lang Norleen POUR, PA-C 04/26/24 1202    Emil Share, DO 04/26/24 1220

## 2024-04-26 NOTE — ED Triage Notes (Signed)
 Patient here POV from Home.  Endorses Right Knee Pain for about 6 months. Sent by [facility] to assess.   NAD noted during Triage. A&Ox4. Gcs 15. Ambulatory/
